# Patient Record
Sex: Female | Born: 1990 | Race: White | Hispanic: No | Marital: Single | Smoking: Current every day smoker
Health system: Southern US, Community
[De-identification: ages and names within clinical notes are randomized; demographics above are authoritative.]

## PROBLEM LIST (undated history)

## (undated) DIAGNOSIS — D689 Coagulation defect, unspecified: Secondary | ICD-10-CM

## (undated) DIAGNOSIS — F419 Anxiety disorder, unspecified: Secondary | ICD-10-CM

## (undated) DIAGNOSIS — A64 Unspecified sexually transmitted disease: Secondary | ICD-10-CM

## (undated) DIAGNOSIS — F1910 Other psychoactive substance abuse, uncomplicated: Secondary | ICD-10-CM

## (undated) DIAGNOSIS — B009 Herpesviral infection, unspecified: Secondary | ICD-10-CM

## (undated) DIAGNOSIS — B1920 Unspecified viral hepatitis C without hepatic coma: Secondary | ICD-10-CM

## (undated) HISTORY — DX: Herpesviral infection, unspecified: B00.9

## (undated) HISTORY — PX: OTHER SURGICAL HISTORY: SHX169

## (undated) HISTORY — PX: WRIST SURGERY: SHX841

## (undated) HISTORY — DX: Unspecified sexually transmitted disease: A64

## (undated) HISTORY — DX: Other psychoactive substance abuse, uncomplicated: F19.10

## (undated) HISTORY — DX: Anxiety disorder, unspecified: F41.9

## (undated) HISTORY — DX: Coagulation defect, unspecified: D68.9

## (undated) HISTORY — DX: Unspecified viral hepatitis C without hepatic coma: B19.20

---

## 2015-12-07 ENCOUNTER — Encounter: Payer: Self-pay | Admitting: Obstetrics and Gynecology

## 2015-12-07 ENCOUNTER — Ambulatory Visit (INDEPENDENT_AMBULATORY_CARE_PROVIDER_SITE_OTHER): Payer: PRIVATE HEALTH INSURANCE | Admitting: Obstetrics and Gynecology

## 2015-12-07 VITALS — BP 136/78 | HR 80 | Resp 12 | Ht 66.5 in | Wt 160.0 lb

## 2015-12-07 DIAGNOSIS — Z124 Encounter for screening for malignant neoplasm of cervix: Secondary | ICD-10-CM | POA: Diagnosis not present

## 2015-12-07 DIAGNOSIS — Z3009 Encounter for other general counseling and advice on contraception: Secondary | ICD-10-CM

## 2015-12-07 DIAGNOSIS — N946 Dysmenorrhea, unspecified: Secondary | ICD-10-CM | POA: Insufficient documentation

## 2015-12-07 DIAGNOSIS — Z87898 Personal history of other specified conditions: Secondary | ICD-10-CM | POA: Diagnosis not present

## 2015-12-07 DIAGNOSIS — Z01419 Encounter for gynecological examination (general) (routine) without abnormal findings: Secondary | ICD-10-CM | POA: Diagnosis not present

## 2015-12-07 DIAGNOSIS — Z0000 Encounter for general adult medical examination without abnormal findings: Secondary | ICD-10-CM | POA: Diagnosis not present

## 2015-12-07 DIAGNOSIS — B199 Unspecified viral hepatitis without hepatic coma: Secondary | ICD-10-CM | POA: Insufficient documentation

## 2015-12-07 DIAGNOSIS — A6000 Herpesviral infection of urogenital system, unspecified: Secondary | ICD-10-CM | POA: Insufficient documentation

## 2015-12-07 DIAGNOSIS — F1911 Other psychoactive substance abuse, in remission: Secondary | ICD-10-CM | POA: Insufficient documentation

## 2015-12-07 DIAGNOSIS — B1920 Unspecified viral hepatitis C without hepatic coma: Secondary | ICD-10-CM | POA: Diagnosis not present

## 2015-12-07 DIAGNOSIS — N898 Other specified noninflammatory disorders of vagina: Secondary | ICD-10-CM | POA: Diagnosis not present

## 2015-12-07 LAB — CBC
HCT: 39.9 % (ref 35.0–45.0)
HEMOGLOBIN: 13.1 g/dL (ref 11.7–15.5)
MCH: 28.3 pg (ref 27.0–33.0)
MCHC: 32.8 g/dL (ref 32.0–36.0)
MCV: 86.2 fL (ref 80.0–100.0)
MPV: 10.7 fL (ref 7.5–12.5)
Platelets: 262 10*3/uL (ref 140–400)
RBC: 4.63 MIL/uL (ref 3.80–5.10)
RDW: 16 % — ABNORMAL HIGH (ref 11.0–15.0)
WBC: 6.9 10*3/uL (ref 3.8–10.8)

## 2015-12-07 MED ORDER — NAPROXEN SODIUM 550 MG PO TABS: 550.0000 mg | ORAL_TABLET | Freq: Two times a day (BID) | ORAL | 2 refills | Status: DC

## 2015-12-07 MED ORDER — LEVONORGEST-ETH ESTRAD 91-DAY 0.1-0.02 & 0.01 MG PO TABS: 1.0000 | ORAL_TABLET | Freq: Every day | ORAL | 0 refills | Status: DC

## 2015-12-07 MED ORDER — VALACYCLOVIR HCL 500 MG PO TABS: ORAL_TABLET | ORAL | 1 refills | Status: DC

## 2015-12-07 NOTE — Progress Notes (Signed)
25 y.o. G0P0000 SingleCaucasianF here for annual exam.  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  She had her first outbreak of HSV 2 weeks ago. She was treated with Valtrex, she currently has a small vulvar bump.  She has hepatitis C, her rehab facility is getting her into see a specialist. She doesn't think her LFT's were elevated at last check.  She is on OCP's Period Duration (Days): 5 days  Period Pattern: Regular Menstrual Flow: Moderate Menstrual Control: Tampon Dysmenorrhea: (!) Severe Dysmenorrhea Symptoms: Cramping, Nausea  Saturates a regular tampon in 4 hours. No BTB. Cramps are severe even on OCP's, gets nausea and backaches. Able to function, Aleve helps.  Currently sexually active, new partner x 2 months. Always uses a condom. Just had full STD testing at the walk in clinic 2 weeks ago, all negative.   Patient's last menstrual period was 11/05/2015.          Sexually active: Yes.    The current method of family planning is OCP (estrogen/progesterone).    Exercising: Yes.    cardio Smoker:  no  Health Maintenance: Pap:  2015 - WNL per patient History of abnormal Pap:  no TDaP:  unsure Gardasil: completed all 3    reports that she has never smoked. She has never used smokeless tobacco. She reports that she does not drink alcohol or use drugs.She is from [place], [place]. Family is very supportive. Working at [employer] as a Production assistant, radio, needs to get GED then wants to go to college.   Past Medical History:  Diagnosis Date  . Anxiety   . Clotting disorder (HCC)   . Hepatitis C virus   . HSV-2 (herpes simplex virus 2) infection   . STD (sexually transmitted disease)    HSV II  . 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 arm surgery Right   . WRIST SURGERY Left    abcess     Current Outpatient Prescriptions  Medication Sig Dispense Refill  . citalopram (CELEXA) 40 MG tablet  Take 40 mg by mouth every morning.  0  . gabapentin (NEURONTIN) 300 MG capsule Take 300 mg by mouth 2 (two) times daily.  0  . naltrexone (DEPADE) 50 MG tablet Take 50 mg by mouth every morning.  0  . traZODone (DESYREL) 100 MG tablet Take 100 mg by mouth at bedtime.  0  . TRI-LINYAH 0.18/0.215/0.25 MG-35 MCG tablet Take 1 tablet by mouth daily.  0   No current facility-administered medications for this visit.     Family History  Problem Relation Age of Onset  . Melanoma Mother   . Hypertension Father   . Hyperlipidemia Father   . Diabetes Maternal Grandmother   . Diabetes Maternal Grandfather     Review of Systems  Constitutional: Negative.   HENT: Negative.   Eyes: Negative.   Respiratory: Negative.   Cardiovascular: Negative.   Gastrointestinal: Negative.   Endocrine: Negative.   Genitourinary: Negative.   Musculoskeletal: Negative.   Skin: Negative.   Allergic/Immunologic: Negative.   Neurological: Negative.   Psychiatric/Behavioral: Negative.     Exam:   BP 136/78 (BP Location: Right Arm, Patient Position: Sitting, Cuff Size: Normal)   Pulse 80   Resp 12   Ht 5' 6.5" (1.689 m)   Wt 160 lb (72.6 kg)   LMP 11/05/2015   BMI 25.44 kg/m  Weight change: @WEIGHTCHANGE @ Height:   Height: 5' 6.5" (168.9 cm)  Ht Readings from Last 3 Encounters:  12/07/15 5' 6.5" (1.689 m)    General appearance: alert, cooperative and appears stated age Head: Normocephalic, without obvious abnormality, atraumatic Neck: no adenopathy, supple, symmetrical, trachea midline and thyroid normal to inspection and palpation Lungs: clear to auscultation bilaterally Breasts: normal appearance, no masses or tenderness Heart: regular rate and rhythm Abdomen: soft, non-tender; bowel sounds normal; no masses,  no organomegaly Extremities: extremities normal, atraumatic, no cyanosis or edema Skin: Skin color, texture, turgor normal. No rashes or lesions Lymph nodes: Cervical, supraclavicular, and  axillary nodes normal. No abnormal inguinal nodes palpated Neurologic: Grossly normal   Pelvic: External genitalia:  no lesions, bump in question is a pimple on her left upper thigh              Urethra:  normal appearing urethra with no masses, tenderness or lesions              Bartholins and Skenes: normal                 Vagina: normal appearing vagina with an increase in watery vaginal discharge, slight odor (on questioning, the patient has noticed an increased d/c)              Cervix: no lesions               Bimanual Exam:  Uterus:  normal size, contour, position, consistency, mobility, non-tender              Adnexa: no mass, fullness, tenderness               Rectovaginal: Confirms               Anus:  normal sphincter tone, no lesions  Chaperone was present for exam.  A:  Well Woman with normal exam  STD testing just done  Contraception  H/O Hepatitis C, need to check LFT's, she is on OCP's  Dysmenorrhea  HSV   Vaginal discharge  P:   Will change to loseasonique, will call in one pack, as long as LFT's are normal will call in for the rest of the year  Continue to use condoms  Screening labs  Script for valtrex for prn use  Wet prep probe  Anaprox for cramps  Discussed breast self exam  Discussed calcium and vit D intake  Information on mirena IUD given

## 2015-12-07 NOTE — Patient Instructions (Signed)
EXERCISE AND DIET:  We recommended that you start or continue a regular exercise program for good health. Regular exercise means any activity that makes your heart beat faster and makes you sweat.  We recommend exercising at least 30 minutes per day at least 3 days a week, preferably 4 or 5.  We also recommend a diet low in fat and sugar.  Inactivity, poor dietary choices and obesity can cause diabetes, heart attack, stroke, and kidney damage, among others.    ALCOHOL AND SMOKING:  Women should limit their alcohol intake to no more than 7 drinks/beers/glasses of wine (combined, not each!) per week. Moderation of alcohol intake to this level decreases your risk of breast cancer and liver damage. And of course, no recreational drugs are part of a healthy lifestyle.  And absolutely no smoking or even second hand smoke. Most people know smoking can cause heart and lung diseases, but did you know it also contributes to weakening of your bones? Aging of your skin?  Yellowing of your teeth and nails?  CALCIUM AND VITAMIN D:  Adequate intake of calcium and Vitamin D are recommended.  The recommendations for exact amounts of these supplements seem to change often, but generally speaking 600 mg of calcium (either carbonate or citrate) and 800 units of Vitamin D per day seems prudent. Certain women may benefit from higher intake of Vitamin D.  If you are among these women, your doctor will have told you during your visit.    PAP SMEARS:  Pap smears, to check for cervical cancer or precancers,  have traditionally been done yearly, although recent scientific advances have shown that most women can have pap smears less often.  However, every woman still should have a physical exam from her gynecologist every year. It will include a breast check, inspection of the vulva and vagina to check for abnormal growths or skin changes, a visual exam of the cervix, and then an exam to evaluate the size and shape of the uterus and  ovaries.  And after 23 years of age, a rectal exam is indicated to check for rectal cancers. We will also provide age appropriate advice regarding health maintenance, like when you should have certain vaccines, screening for sexually transmitted diseases, bone density testing, colonoscopy, mammograms, etc.   MAMMOGRAMS:  All women over 40 years old should have a yearly mammogram. Many facilities now offer a "3D" mammogram, which may cost around $50 extra out of pocket. If possible,  we recommend you accept the option to have the 3D mammogram performed.  It both reduces the number of women who will be called back for extra views which then turn out to be normal, and it is better than the routine mammogram at detecting truly abnormal areas.    COLONOSCOPY:  Colonoscopy to screen for colon cancer is recommended for all women at age 50.  We know, you hate the idea of the prep.  We agree, BUT, having colon cancer and not knowing it is worse!!  Colon cancer so often starts as a polyp that can be seen and removed at colonscopy, which can quite literally save your life!  And if your first colonoscopy is normal and you have no family history of colon cancer, most women don't have to have it again for 10 years.  Once every ten years, you can do something that may end up saving your life, right?  We will be happy to help you get it scheduled when you are ready.    Be sure to check your insurance coverage so you understand how much it will cost.  It may be covered as a preventative service at no cost, but you should check your particular policy.     Oral Contraception Information Oral contraceptive pills (OCPs) are medicines taken to prevent pregnancy. OCPs work by preventing the ovaries from releasing eggs. The hormones in OCPs also cause the cervical mucus to thicken, preventing the sperm from entering the uterus. The hormones also cause the uterine lining to become thin, not allowing a fertilized egg to attach to the  inside of the uterus. OCPs are highly effective when taken exactly as prescribed. However, OCPs do not prevent sexually transmitted diseases (STDs). Safe sex practices, such as using condoms along with the pill, can help prevent STDs.  Before taking the pill, you may have a physical exam and Pap test. Your health care provider may order blood tests. The health care provider will make sure you are a good candidate for oral contraception. Discuss with your health care provider the possible side effects of the OCP you may be prescribed. When starting an OCP, it can take 2 to 3 months for the body to adjust to the changes in hormone levels in your body.  TYPES OF ORAL CONTRACEPTION  The combination pill--This pill contains estrogen and progestin (synthetic progesterone) hormones. The combination pill comes in 21-day, 28-day, or 91-day packs. Some types of combination pills are meant to be taken continuously (365-day pills). With 21-day packs, you do not take pills for 7 days after the last pill. With 28-day packs, the pill is taken every day. The last 7 pills are without hormones. Certain types of pills have more than 21 hormone-containing pills. With 91-day packs, the first 84 pills contain both hormones, and the last 7 pills contain no hormones or contain estrogen only.  The minipill--This pill contains the progesterone hormone only. The pill is taken every day continuously. It is very important to take the pill at the same time each day. The minipill comes in packs of 28 pills. All 28 pills contain the hormone.  ADVANTAGES OF ORAL CONTRACEPTIVE PILLS  Decreases premenstrual symptoms.   Treats menstrual period cramps.   Regulates the menstrual cycle.   Decreases a heavy menstrual flow.   May treatacne, depending on the type of pill.   Treats abnormal uterine bleeding.   Treats polycystic ovarian syndrome.   Treats endometriosis.   Can be used as emergency contraception.  THINGS  THAT CAN MAKE ORAL CONTRACEPTIVE PILLS LESS EFFECTIVE OCPs can be less effective if:   You forget to take the pill at the same time every day.   You have a stomach or intestinal disease that lessens the absorption of the pill.   You take OCPs with other medicines that make OCPs less effective, such as antibiotics, certain HIV medicines, and some seizure medicines.   You take expired OCPs.   You forget to restart the pill on day 7, when using the packs of 21 pills.  RISKS ASSOCIATED WITH ORAL CONTRACEPTIVE PILLS  Oral contraceptive pills can sometimes cause side effects, such as:  Headache.  Nausea.  Breast tenderness.  Irregular bleeding or spotting. Combination pills are also associated with a small increased risk of:  Blood clots.  Heart attack.  Stroke.   This information is not intended to replace advice given to you by your health care provider. Make sure you discuss any questions you have with your health care provider.     Document Released: 06/16/2002 Document Revised: 01/14/2013 Document Reviewed: 09/14/2012 Elsevier Interactive Patient Education 2016 Elsevier Inc.  Breast Self-Awareness Practicing breast self-awareness may pick up problems early, prevent significant medical complications, and possibly save your life. By practicing breast self-awareness, you can become familiar with how your breasts look and feel and if your breasts are changing. This allows you to notice changes early. It can also offer you some reassurance that your breast health is good. One way to learn what is normal for your breasts and whether your breasts are changing is to do a breast self-exam. If you find a lump or something that was not present in the past, it is best to contact your caregiver right away. Other findings that should be evaluated by your caregiver include nipple discharge, especially if it is bloody; skin changes or reddening; areas where the skin seems to be pulled in  (retracted); or new lumps and bumps. Breast pain is seldom associated with cancer (malignancy), but should also be evaluated by a caregiver. HOW TO PERFORM A BREAST SELF-EXAM The best time to examine your breasts is 5-7 days after your menstrual period is over. During menstruation, the breasts are lumpier, and it may be more difficult to pick up changes. If you do not menstruate, have reached menopause, or had your uterus removed (hysterectomy), you should examine your breasts at regular intervals, such as monthly. If you are breastfeeding, examine your breasts after a feeding or after using a breast pump. Breast implants do not decrease the risk for lumps or tumors, so continue to perform breast self-exams as recommended. Talk to your caregiver about how to determine the difference between the implant and breast tissue. Also, talk about the amount of pressure you should use during the exam. Over time, you will become more familiar with the variations of your breasts and more comfortable with the exam. A breast self-exam requires you to remove all your clothes above the waist. 1. Look at your breasts and nipples. Stand in front of a mirror in a room with good lighting. With your hands on your hips, push your hands firmly downward. Look for a difference in shape, contour, and size from one breast to the other (asymmetry). Asymmetry includes puckers, dips, or bumps. Also, look for skin changes, such as reddened or scaly areas on the breasts. Look for nipple changes, such as discharge, dimpling, repositioning, or redness. 2. Carefully feel your breasts. This is best done either in the shower or tub while using soapy water or when flat on your back. Place the arm (on the side of the breast you are examining) above your head. Use the pads (not the fingertips) of your three middle fingers on your opposite hand to feel your breasts. Start in the underarm area and use  inch (2 cm) overlapping circles to feel your  breast. Use 3 different levels of pressure (light, medium, and firm pressure) at each circle before moving to the next circle. The light pressure is needed to feel the tissue closest to the skin. The medium pressure will help to feel breast tissue a little deeper, while the firm pressure is needed to feel the tissue close to the ribs. Continue the overlapping circles, moving downward over the breast until you feel your ribs below your breast. Then, move one finger-width towards the center of the body. Continue to use the  inch (2 cm) overlapping circles to feel your breast as you move slowly up toward the collar bone (clavicle) near the  base of the neck. Continue the up and down exam using all 3 pressures until you reach the middle of the chest. Do this with each breast, carefully feeling for lumps or changes. 3.  Keep a written record with breast changes or normal findings for each breast. By writing this information down, you do not need to depend only on memory for size, tenderness, or location. Write down where you are in your menstrual cycle, if you are still menstruating. Breast tissue can have some lumps or thick tissue. However, see your caregiver if you find anything that concerns you.  SEEK MEDICAL CARE IF:  You see a change in shape, contour, or size of your breasts or nipples.   You see skin changes, such as reddened or scaly areas on the breasts or nipples.   You have an unusual discharge from your nipples.   You feel a new lump or unusually thick areas.    This information is not intended to replace advice given to you by your health care provider. Make sure you discuss any questions you have with your health care provider.   Document Released: 03/26/2005 Document Revised: 03/12/2012 Document Reviewed: 07/11/2011 Elsevier Interactive Patient Education 2016 Elsevier Inc.  

## 2015-12-08 ENCOUNTER — Telehealth: Payer: Self-pay | Admitting: *Deleted

## 2015-12-08 LAB — COMPREHENSIVE METABOLIC PANEL
ALBUMIN: 3.8 g/dL (ref 3.6–5.1)
ALK PHOS: 37 U/L (ref 33–115)
ALT: 145 U/L — AB (ref 6–29)
AST: 87 U/L — AB (ref 10–30)
BILIRUBIN TOTAL: 0.4 mg/dL (ref 0.2–1.2)
BUN: 12 mg/dL (ref 7–25)
CHLORIDE: 104 mmol/L (ref 98–110)
CO2: 28 mmol/L (ref 20–31)
CREATININE: 0.88 mg/dL (ref 0.50–1.10)
Calcium: 9.1 mg/dL (ref 8.6–10.2)
Glucose, Bld: 66 mg/dL (ref 65–99)
Potassium: 4.6 mmol/L (ref 3.5–5.3)
SODIUM: 139 mmol/L (ref 135–146)
TOTAL PROTEIN: 7.4 g/dL (ref 6.1–8.1)

## 2015-12-08 LAB — WET PREP BY MOLECULAR PROBE
Candida species: NEGATIVE
Gardnerella vaginalis: POSITIVE — AB
Trichomonas vaginosis: NEGATIVE

## 2015-12-08 LAB — LIPID PANEL
CHOLESTEROL: 170 mg/dL (ref 125–200)
HDL: 66 mg/dL (ref >=46)
LDL Cholesterol: 84 mg/dL (ref <130)
TRIGLYCERIDES: 100 mg/dL (ref <150)
Total CHOL/HDL Ratio: 2.6 Ratio (ref <=5.0)
VLDL: 20 mg/dL (ref <30)

## 2015-12-08 LAB — VITAMIN D 25 HYDROXY (VIT D DEFICIENCY, FRACTURES): Vit D, 25-Hydroxy: 35 ng/mL (ref 30–100)

## 2015-12-08 NOTE — Telephone Encounter (Signed)
Left message to call regarding results -eh 

## 2015-12-08 NOTE — Telephone Encounter (Signed)
-----   Message from Romualdo BolkJill Evelyn Jertson, MD sent at 12/08/2015 10:03 AM EDT ----- Please inform the patient that her LFT's are mild to moderately elevated (she has hepatitis C and is getting in to see a specialist). Oral contraception is not the best method of contraception for her. The mirena IUD would be a better choice for her (heavy/crampy cycles), we briefly discussed the IUD yesterday, information was given. See if she wants to come in to further discuss, or if she wants to make an appointment for IUD insertion. If so would pre-treat with cytotec.  She is also + for BV, please treat with metrogel, 1 application intravaginally qhs x 5 days.  The rest of her blood work was normal.  Pap is pending.

## 2015-12-08 NOTE — Telephone Encounter (Signed)
Patient is returning a call to Elaine. °

## 2015-12-09 NOTE — Telephone Encounter (Signed)
Called patient back- left another message to call back for lab results-eh

## 2015-12-13 ENCOUNTER — Other Ambulatory Visit: Payer: Self-pay | Admitting: Obstetrics and Gynecology

## 2015-12-13 LAB — IPS PAP TEST WITH HPV

## 2015-12-13 NOTE — Telephone Encounter (Signed)
Left patient another message returning -eh

## 2015-12-13 NOTE — Telephone Encounter (Signed)
Patient returning call.

## 2015-12-20 ENCOUNTER — Other Ambulatory Visit: Payer: Self-pay

## 2015-12-20 ENCOUNTER — Encounter: Payer: Self-pay | Admitting: Obstetrics and Gynecology

## 2015-12-20 ENCOUNTER — Ambulatory Visit (INDEPENDENT_AMBULATORY_CARE_PROVIDER_SITE_OTHER): Payer: PRIVATE HEALTH INSURANCE | Admitting: Obstetrics and Gynecology

## 2015-12-20 VITALS — BP 120/80 | HR 82 | Resp 16 | Ht 65.5 in | Wt 161.4 lb

## 2015-12-20 DIAGNOSIS — Z3009 Encounter for other general counseling and advice on contraception: Secondary | ICD-10-CM | POA: Diagnosis not present

## 2015-12-20 DIAGNOSIS — B1920 Unspecified viral hepatitis C without hepatic coma: Secondary | ICD-10-CM | POA: Diagnosis not present

## 2015-12-20 DIAGNOSIS — R7989 Other specified abnormal findings of blood chemistry: Secondary | ICD-10-CM

## 2015-12-20 DIAGNOSIS — R945 Abnormal results of liver function studies: Secondary | ICD-10-CM

## 2015-12-20 MED ORDER — METRONIDAZOLE 0.75 % VA GEL: 1.0000 | Freq: Every day | VAGINAL | 0 refills | Status: AC

## 2015-12-20 MED ORDER — MISOPROSTOL 200 MCG PO TABS: ORAL_TABLET | ORAL | 0 refills | Status: DC

## 2015-12-20 NOTE — Progress Notes (Signed)
GYNECOLOGY  VISIT   HPI: 25 y.o.   Single  Caucasian  female   G0P0000 with Patient's last menstrual period was 12/14/2015.   here to discuss Mirena IUD.    The patient has a h/o hepatitis C. At her recent annual exam, LFT's were drawn and were slightly elevated. She is currently on OCP's and is here to discuss changing to an IUD secondary to her liver disease.  She is 120 days clean.  Prior to OCP's cycles were monthly, heavy and crampy  GYNECOLOGIC HISTORY: Patient's last menstrual period was 12/14/2015. Contraception:OCP's  Menopausal hormone therapy: none        OB History    Gravida Para Term Preterm AB Living   0 0 0 0 0 0   SAB TAB Ectopic Multiple Live Births   0 0 0 0 0         Patient Active Problem List   Diagnosis Date Noted  . Dysmenorrhea 12/07/2015  . 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 Hepatitis, viral 12/07/2015  . History of substance abuse 12/07/2015    Past Medical History:  Diagnosis Date  . Anxiety   . Clotting disorder (HCC)   . Hepatitis C virus   . HSV-2 (herpes simplex virus 2) infection   . STD (sexually transmitted disease)    HSV II  . 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 arm surgery Right   . WRIST SURGERY Left    abcess     Current Outpatient Prescriptions  Medication Sig Dispense Refill  . citalopram (CELEXA) 40 MG tablet Take 40 mg by mouth every morning.  0  . gabapentin (NEURONTIN) 300 MG capsule Take 300 mg by mouth 2 (two) times daily.  0  . Levonorgestrel-Ethinyl Estradiol (LOSEASONIQUE) 0.1-0.02 & 0.01 MG tablet Take 1 tablet by mouth daily. 1 Package 0  . naltrexone (DEPADE) 50 MG tablet Take 50 mg by mouth every morning.  0  . naproxen sodium (ANAPROX DS) 550 MG tablet Take 1 tablet (550 mg total) by mouth 2 (two) times daily with a meal. 30 tablet 2  . traZODone (DESYREL) 100 MG tablet Take 100 mg by mouth at bedtime.  0  .  valACYclovir (VALTREX) 500 MG tablet Take 1 tablet po bid x 3 days prn 30 tablet 1   No current facility-administered medications for this visit.      ALLERGIES: Review of patient's allergies indicates no known allergies.  Family History  Problem Relation Age of Onset  . Melanoma Mother   . Hypertension Father   . Hyperlipidemia Father   . Diabetes Maternal Grandmother   . Diabetes Maternal Grandfather     Social History   Social History  . Marital status: Single    Spouse name: N/A  . Number of children: N/A  . Years of education: N/A   Occupational History  . Not on file.   Social History Main Topics  . Smoking status: Never Smoker  . Smokeless tobacco: Never Used  . Alcohol use No  . Drug use: No     Comment: clean for 100 days from opioids and herion 12-07-15   . Sexual activity: Yes    Partners: Male    Birth control/ protection: Pill   Other Topics Concern  . Not on file   Social History Narrative  . No narrative on file    Review of  Systems  Constitutional: Negative.   HENT: Negative.   Eyes: Negative.   Respiratory: Negative.   Cardiovascular: Negative.   Gastrointestinal: Negative.   Genitourinary: Negative.   Musculoskeletal: Negative.   Skin: Negative.   Neurological: Negative.   Endo/Heme/Allergies: Negative.   Psychiatric/Behavioral: Negative.     PHYSICAL EXAMINATION:    BP 120/80 (BP Location: Right Arm, Patient Position: Sitting, Cuff Size: Normal)   Pulse 82   Resp 16   Ht 5' 5.5" (1.664 m)   Wt 161 lb 6.4 oz (73.2 kg)   LMP 12/14/2015   BMI 26.45 kg/m     General appearance: alert, cooperative and appears stated age  ASSESSMENT H/O hepatitis C, slightly elevated LFT's STD testing was done, need a copy Contraception, on OCP's, have recommended she switch to an IUD to prevent effects on her liver BV on recent testing H/O heroin abuse, sober for 120 days    PLAN Referral to Hepatologist (she was supposed to be referred by  her drug rehab center, hasn't happened yet, she requests we set her up) Metrogel called in today F/U in 1 week for [facility] IUD insertion, will pre-treat with cytotec She is currently on her cycle. She will stay on OCP's until after IUD insertion We discussed importance of long term condom usage     An After Visit Summary was printed and given to the patient.  Over 20 minutes face to face time of which over 50% was spent in counseling.

## 2015-12-20 NOTE — Telephone Encounter (Signed)
Summer, CMA spoke with patient and informed of results -see result note -eh

## 2015-12-20 NOTE — Telephone Encounter (Signed)
Per Dr. Salli QuarryJertson's note, Metrogel prescription was sent into pharmacy on file. -sco

## 2015-12-20 NOTE — Patient Instructions (Signed)
Intrauterine Device Insertion Most often, an intrauterine device (IUD) is inserted into the uterus to prevent pregnancy. There are 2 types of IUDs available:  Copper IUD--This type of IUD creates an environment that is not favorable to sperm survival. The mechanism of action of the copper IUD is not known for certain. It can stay in place for 10 years.  Hormone IUD--This type of IUD contains the hormone progestin (synthetic progesterone). The progestin thickens the cervical mucus and prevents sperm from entering the uterus, and it also thins the uterine lining. There is no evidence that the hormone IUD prevents implantation. One hormone IUD can stay in place for up to 5 years, and a different hormone IUD can stay in place for up to 3 years. An IUD is the most cost-effective birth control if left in place for the full duration. It may be removed at any time. LET YOUR HEALTH CARE PROVIDER KNOW ABOUT:  Any allergies you have.  All medicines you are taking, including vitamins, herbs, eye drops, creams, and over-the-counter medicines.  Previous problems you or members of your family have had with the use of anesthetics.  Any blood disorders you have.  Previous surgeries you have had.  Possibility of pregnancy.  Medical conditions you have. RISKS AND COMPLICATIONS  Generally, intrauterine device insertion is a safe procedure. However, as with any procedure, complications can occur. Possible complications include:  Accidental puncture (perforation) of the uterus.  Accidental placement of the IUD either in the muscle layer of the uterus (myometrium) or outside the uterus. If this happens, the IUD can be found essentially floating around the bowels and must be taken out surgically.  The IUD may fall out of the uterus (expulsion). This is more common in women who have recently had a child.   Pregnancy in the fallopian tube (ectopic).  Pelvic inflammatory disease (PID), which is infection of  the uterus and fallopian tubes. The risk of PID is slightly increased in the first 20 days after the IUD is placed, but the overall risk is still very low. BEFORE THE PROCEDURE  Schedule the IUD insertion for when you will have your menstrual period or right after, to make sure you are not pregnant. Placement of the IUD is better tolerated shortly after a menstrual cycle.  You may need to take tests or be examined to make sure you are not pregnant.  You may be required to take a pregnancy test.  You may be required to get checked for sexually transmitted infections (STIs) prior to placement. Placing an IUD in someone who has an infection can make the infection worse.  You may be given a pain reliever to take 1 or 2 hours before the procedure.  An exam will be performed to determine the size and position of your uterus.  Ask your health care provider about changing or stopping your regular medicines. PROCEDURE   A tool (speculum) is placed in the vagina. This allows your health care provider to see the lower part of the uterus (cervix).  The cervix is prepped with a medicine that lowers the risk of infection.  You may be given a medicine to numb each side of the cervix (intracervical or paracervical block). This is used to block and control any discomfort with insertion.  A tool (uterine sound) is inserted into the uterus to determine the length of the uterine cavity and the direction the uterus may be tilted.  A slim instrument (IUD inserter) is inserted through the cervical   canal and into your uterus.  The IUD is placed in the uterine cavity and the insertion device is removed.  The nylon string that is attached to the IUD and used for eventual IUD removal is trimmed. It is trimmed so that it lays high in the vagina, just outside the cervix. AFTER THE PROCEDURE  You may have bleeding after the procedure. This is normal. It varies from light spotting for a few days to menstrual-like  bleeding.  You may have mild cramping.   This information is not intended to replace advice given to you by your health care provider. Make sure you discuss any questions you have with your health care provider.   Document Released: 11/22/2010 Document Revised: 01/14/2013 Document Reviewed: 09/14/2012 Elsevier Interactive Patient Education 2016 [employer]  

## 2015-12-27 ENCOUNTER — Telehealth: Payer: Self-pay | Admitting: Obstetrics and Gynecology

## 2015-12-27 NOTE — Telephone Encounter (Signed)
Spoke with patient. Patient states she has been on her  cycle since 12/08/15; reporting light bleeding currently. Patient would like to know if still ok to go ahead with IUD placement? Advised patient if she has been taking OCP daily and has not missed any doses, ok to go ahead with placement. Patient states she has taken OCP daily, no missed doses. Patient also ask if ok to use metrogel at night and place the cytotec? Advised patient to place metrogel tonight and place cytotec early in the morning, 5-6am,  in order to be effective. IUD placement scheduled 12/28/15 at 1:00pm with Dr. Oscar LaJertson. Patient is agreeable.  Dr. Reyne DumasJerston, do you agree with recommendations?

## 2015-12-27 NOTE — Telephone Encounter (Signed)
Patient has a few questions before having her IUD inserted tomorrow.

## 2015-12-28 ENCOUNTER — Ambulatory Visit (INDEPENDENT_AMBULATORY_CARE_PROVIDER_SITE_OTHER): Payer: PRIVATE HEALTH INSURANCE | Admitting: Obstetrics and Gynecology

## 2015-12-28 ENCOUNTER — Encounter: Payer: Self-pay | Admitting: Obstetrics and Gynecology

## 2015-12-28 VITALS — BP 138/60 | HR 84 | Resp 12 | Wt 165.0 lb

## 2015-12-28 DIAGNOSIS — Z113 Encounter for screening for infections with a predominantly sexual mode of transmission: Secondary | ICD-10-CM

## 2015-12-28 DIAGNOSIS — B1920 Unspecified viral hepatitis C without hepatic coma: Secondary | ICD-10-CM

## 2015-12-28 DIAGNOSIS — Z30430 Encounter for insertion of intrauterine contraceptive device: Secondary | ICD-10-CM | POA: Diagnosis not present

## 2015-12-28 DIAGNOSIS — Z01812 Encounter for preprocedural laboratory examination: Secondary | ICD-10-CM

## 2015-12-28 DIAGNOSIS — Z3009 Encounter for other general counseling and advice on contraception: Secondary | ICD-10-CM

## 2015-12-28 LAB — POCT URINE PREGNANCY: PREG TEST UR: NEGATIVE

## 2015-12-28 NOTE — Patient Instructions (Signed)

## 2015-12-28 NOTE — Progress Notes (Addendum)
GYNECOLOGY  VISIT   HPI: 25 y.o.   Single  Caucasian  female   [id number] with Patient's last menstrual period was 12/08/2015.   here for IUD insertion. She has hepatitis C and has mildly elevated LFT's. Awaiting appointment at the hepatitis C clinic.   GYNECOLOGIC HISTORY: Patient's last menstrual period was 12/08/2015. Contraception:OCP Menopausal hormone therapy: None         OB History    Gravida Para Term Preterm AB Living   0 0 0 0 0 0   SAB TAB Ectopic Multiple Live Births   0 0 0 0 0         Patient Active Problem List   Diagnosis Date Noted  . Dysmenorrhea 12/07/2015  . 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 Hepatitis, viral 12/07/2015  . History of substance abuse 12/07/2015    Past Medical History:  Diagnosis Date  . Anxiety   . Clotting disorder (HCC)   . Hepatitis C virus   . HSV-2 (herpes simplex virus 2) infection   . STD (sexually transmitted disease)    HSV II  . 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 arm surgery Right   . WRIST SURGERY Left    abcess     Current Outpatient Prescriptions  Medication Sig Dispense Refill  . citalopram (CELEXA) 40 MG tablet Take 40 mg by mouth every morning.  0  . gabapentin (NEURONTIN) 300 MG capsule Take 300 mg by mouth 2 (two) times daily.  0  . Levonorgestrel-Ethinyl Estradiol (LOSEASONIQUE) 0.1-0.02 & 0.01 MG tablet Take 1 tablet by mouth daily. 1 Package 0  . misoprostol (CYTOTEC) 200 MCG tablet Place 2 tablets intravaginally 6-12 hours prior to IUD insertion 2 tablet 0  . naltrexone (DEPADE) 50 MG tablet Take 50 mg by mouth every morning.  0  . naproxen sodium (ANAPROX DS) 550 MG tablet Take 1 tablet (550 mg total) by mouth 2 (two) times daily with a meal. 30 tablet 2  . traZODone (DESYREL) 100 MG tablet Take 100 mg by mouth at bedtime.  0  . valACYclovir (VALTREX) 500 MG tablet Take 1 tablet po bid x 3 days prn 30 tablet 1   No  current facility-administered medications for this visit.      ALLERGIES: Review of patient's allergies indicates no known allergies.  Family History  Problem Relation Age of Onset  . Melanoma Mother   . Hypertension Father   . Hyperlipidemia Father   . Diabetes Maternal Grandmother   . Diabetes Maternal Grandfather     Social History   Social History  . Marital status: Single    Spouse name: N/A  . Number of children: N/A  . Years of education: N/A   Occupational History  . Not on file.   Social History Main Topics  . Smoking status: Never Smoker  . Smokeless tobacco: Never Used  . Alcohol use No  . Drug use: No     Comment: clean for 100 days from opioids and herion 12-07-15   . Sexual activity: Yes    Partners: Male    Birth control/ protection: Pill   Other Topics Concern  . Not on file   Social History Narrative  . No narrative on file    Review of Systems  Constitutional: Negative.   HENT: Negative.   Eyes: Negative.   Respiratory: Negative.   Cardiovascular: Negative.  Gastrointestinal: Negative.   Genitourinary: Negative.   Musculoskeletal: Negative.   Skin: Negative.   Neurological: Negative.   Endo/Heme/Allergies: Negative.   Psychiatric/Behavioral: Negative.     PHYSICAL EXAMINATION:    BP 138/60 (BP Location: Right Arm, Patient Position: Sitting, Cuff Size: Normal)   Pulse 84   Resp 12   Wt 165 lb (74.8 kg)   LMP 12/08/2015   BMI 27.04 kg/m     General appearance: alert, cooperative and appears stated age  Pelvic: External genitalia:  no lesions              Urethra:  normal appearing urethra with no masses, tenderness or lesions              Bartholins and Skenes: normal                 Vagina: normal appearing vagina with normal color and discharge, no lesions              Cervix: no lesions  The risks of the kyleena IUD were reviewed with the patient, including infection, abnormal bleeding and uterine perfortion. Consent was  signed.  A speculum was placed in the vagina, the cervix was cleansed with betadine. A tenaculum was placed on the cervix, the uterus sounded to 6-7 cm.  The kyleena IUD was inserted without difficulty. The string were cut to 3-4 cm. The tenaculum was removed. Slight oozing from the tenaculum site was stopped with pressure.   The patient tolerated the procedure well.    Chaperone was present for exam.  ASSESSMENT Kyleena IUD insertion STD screening, records haven't been received yet Hepatitis C    PLAN F/U in 1 month Genprobe Check on referral to hepatitis C clinic   An After Visit Summary was printed and given to the patient.  Addendum:  Labs from 11/22/15 Negative GC Negative Chlamydia RPR Non reactive HIV Non Reactive

## 2015-12-29 ENCOUNTER — Telehealth: Payer: Self-pay | Admitting: Obstetrics and Gynecology

## 2015-12-29 LAB — GC/CHLAMYDIA PROBE AMP
CT PROBE, AMP APTIMA: NOT DETECTED
GC PROBE AMP APTIMA: NOT DETECTED

## 2015-12-29 NOTE — Telephone Encounter (Signed)
Left message on voicemail to call with information about where she had labs done. Per [facility] they do not have any labs on her. Release is in records folder.

## 2016-01-09 ENCOUNTER — Telehealth: Payer: Self-pay | Admitting: Obstetrics and Gynecology

## 2016-01-09 NOTE — Telephone Encounter (Signed)
Left message regarding upcoming appointment has been canceled and needs to be rescheduled. °

## 2016-01-17 ENCOUNTER — Encounter (HOSPITAL_COMMUNITY): Payer: Self-pay | Admitting: Emergency Medicine

## 2016-01-17 ENCOUNTER — Emergency Department (HOSPITAL_COMMUNITY)
Admission: EM | Admit: 2016-01-17 | Discharge: 2016-01-17 | Disposition: A | Discharge status: Home / Self Care | Payer: PRIVATE HEALTH INSURANCE | Source: Home / Self Care | Attending: Emergency Medicine | Admitting: Emergency Medicine

## 2016-01-17 DIAGNOSIS — R569 Unspecified convulsions: Secondary | ICD-10-CM | POA: Diagnosis not present

## 2016-01-17 DIAGNOSIS — Z79899 Other long term (current) drug therapy: Secondary | ICD-10-CM

## 2016-01-17 DIAGNOSIS — T401X1A Poisoning by heroin, accidental (unintentional), initial encounter: Secondary | ICD-10-CM

## 2016-01-17 LAB — RAPID URINE DRUG SCREEN, HOSP PERFORMED
AMPHETAMINES: NOT DETECTED
BARBITURATES: NOT DETECTED
BENZODIAZEPINES: NOT DETECTED
Cocaine: POSITIVE — AB
Opiates: POSITIVE — AB
Tetrahydrocannabinol: NOT DETECTED

## 2016-01-17 LAB — COMPREHENSIVE METABOLIC PANEL
ALBUMIN: 4 g/dL (ref 3.5–5.0)
ALK PHOS: 37 U/L — AB (ref 38–126)
ALT: 72 U/L — AB (ref 14–54)
AST: 52 U/L — AB (ref 15–41)
Anion gap: 12 (ref 5–15)
BILIRUBIN TOTAL: 0.5 mg/dL (ref 0.3–1.2)
BUN: 13 mg/dL (ref 6–20)
CO2: 23 mmol/L (ref 22–32)
CREATININE: 1.07 mg/dL — AB (ref 0.44–1.00)
Calcium: 8.6 mg/dL — ABNORMAL LOW (ref 8.9–10.3)
Chloride: 102 mmol/L (ref 101–111)
GFR calc Af Amer: >60 mL/min (ref >60)
GLUCOSE: 241 mg/dL — AB (ref 65–99)
Potassium: 3.5 mmol/L (ref 3.5–5.1)
Sodium: 137 mmol/L (ref 135–145)
TOTAL PROTEIN: 8 g/dL (ref 6.5–8.1)

## 2016-01-17 LAB — CBC
HEMATOCRIT: 36.1 % (ref 36.0–46.0)
Hemoglobin: 11.9 g/dL — ABNORMAL LOW (ref 12.0–15.0)
MCH: 28.7 pg (ref 26.0–34.0)
MCHC: 33 g/dL (ref 30.0–36.0)
MCV: 87.2 fL (ref 78.0–100.0)
PLATELETS: 240 10*3/uL (ref 150–400)
RBC: 4.14 MIL/uL (ref 3.87–5.11)
RDW: 13.9 % (ref 11.5–15.5)
WBC: 6.9 10*3/uL (ref 4.0–10.5)

## 2016-01-17 LAB — SALICYLATE LEVEL: Salicylate Lvl: <7 mg/dL (ref 2.8–30.0)

## 2016-01-17 LAB — ETHANOL

## 2016-01-17 LAB — CBG MONITORING, ED: GLUCOSE-CAPILLARY: 237 mg/dL — AB (ref 65–99)

## 2016-01-17 LAB — ACETAMINOPHEN LEVEL: Acetaminophen (Tylenol), Serum: <10 ug/mL — ABNORMAL LOW (ref 10–30)

## 2016-01-17 NOTE — ED Notes (Signed)
Pt looking for wallet. Called EMS dispatch who then talked to the EMS who picked pt up on scene. The EMT stated that she never saw a wallet and was handed just an ID card by police.

## 2016-01-17 NOTE — ED Triage Notes (Signed)
Pt found unresponsive outside of home that she was kicked out of 2 days ago; respiratory arrest per EMS, pinpoint pupils; pt received 2mg  of Narcan and became responsive several minutes later; pt reports doing heroin, cocaine, and crack tonight; denies alcohol; A&Ox4

## 2016-01-17 NOTE — ED Notes (Signed)
Bed: RESB Expected date:  Expected time:  Means of arrival:  Comments: 25 yo F OD

## 2016-01-17 NOTE — Telephone Encounter (Signed)
Left message to call regarding release.

## 2016-01-17 NOTE — ED Provider Notes (Addendum)
[facility]-EMERGENCY DEPT Provider Note   CSN: [id number] Arrival date & time: 01/17/16  [id number]  By signing my name below, I, Brandy Sherman, attest that this documentation has been prepared under the direction and in the presence of physician practitioner, Dione Boozeavid Antone Summons, MD. Electronically Signed: Linna Darnerussell Sherman, Scribe. 01/17/2016. 1:21 AM.  History   Chief Complaint Chief Complaint  Patient presents with  . Drug Overdose    The history is provided by the patient. No language interpreter was used.     HPI Comments: Brandy Sherman is a 25 y.o. female brought in by EMS, with PMHx significant for substance abuse, who presents to the Emergency Department complaining of drug overdose occurring shortly PTA. Pt reports she injected heroine and also used crack and cocaine tonight. Pt notes some palpitations currently. She denies SI or any other associated symptoms.  PCP: Dr. Donnetta SimpersYousef  Past Medical History:  Diagnosis Date  . Anxiety   . Clotting disorder (HCC)   . Hepatitis C virus   . HSV-2 (herpes simplex virus 2) infection   . STD (sexually transmitted disease)    HSV II  . 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 Dysmenorrhea 12/07/2015  . 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 Hepatitis, viral 12/07/2015  . History of substance abuse 12/07/2015    Past Surgical History:  Procedure Laterality Date  . arm surgery Right   . WRIST SURGERY Left    abcess     OB History    Gravida Para Term Preterm AB Living   0 0 0 0 0 0   SAB TAB Ectopic Multiple Live Births   0 0 0 0 0       Home Medications    Prior to Admission medications   Medication Sig Start Date End Date Taking? Authorizing Provider  citalopram (CELEXA) 40 MG tablet Take 40 mg by mouth every morning. 11/15/15   Historical Provider, MD  gabapentin (NEURONTIN) 300 MG capsule Take 300 mg by mouth 2 (two) times daily. 11/02/15   Historical  Provider, MD  Levonorgestrel-Ethinyl Estradiol (LOSEASONIQUE) 0.1-0.02 & 0.01 MG tablet Take 1 tablet by mouth daily. 12/07/15   Romualdo BolkJill Evelyn Jertson, MD  misoprostol (CYTOTEC) 200 MCG tablet Place 2 tablets intravaginally 6-12 hours prior to IUD insertion 12/20/15   Romualdo BolkJill Evelyn Jertson, MD  naltrexone (DEPADE) 50 MG tablet Take 50 mg by mouth every morning. 11/11/15   Historical Provider, MD  naproxen sodium (ANAPROX DS) 550 MG tablet Take 1 tablet (550 mg total) by mouth 2 (two) times daily with a meal. 12/07/15   Romualdo BolkJill Evelyn Jertson, MD  traZODone (DESYREL) 100 MG tablet Take 100 mg by mouth at bedtime. 11/05/15   Historical Provider, MD  valACYclovir (VALTREX) 500 MG tablet Take 1 tablet po bid x 3 days prn 12/07/15   Romualdo BolkJill Evelyn Jertson, MD    Family History Family History  Problem Relation Age of Onset  . Melanoma Mother   . Hypertension Father   . Hyperlipidemia Father   . Diabetes Maternal Grandmother   . Diabetes Maternal Grandfather     Social History Social History  Substance Use Topics  . Smoking status: Never Smoker  . Smokeless tobacco: Never Used  . Alcohol use No     Allergies   Review of patient's allergies indicates no known allergies.   Review of Systems Review of Systems  Cardiovascular: Positive  for palpitations.  Psychiatric/Behavioral: Negative for suicidal ideas.  All other systems reviewed and are negative.   Physical Exam Updated Vital Signs BP 128/74 (BP Location: Left Arm)   Pulse (!) 127   Temp 100.5 F (38.1 C) (Oral)   Resp 18   Ht 5\' 6"  (1.676 m)   Wt 160 lb (72.6 kg)   LMP 12/01/2015   SpO2 92%   BMI 25.82 kg/m   Physical Exam  Constitutional: She is oriented to person, place, and time. She appears well-developed and well-nourished.  HENT:  Head: Normocephalic and atraumatic.  Eyes: EOM are normal. Pupils are equal, round, and reactive to light.  Neck: Normal range of motion. Neck supple. No JVD present.  Cardiovascular: Normal rate,  regular rhythm and normal heart sounds.   No murmur heard. Pulmonary/Chest: Effort normal and breath sounds normal. She has no wheezes. She has no rales. She exhibits no tenderness.  Abdominal: Soft. Bowel sounds are normal. She exhibits no distension and no mass. There is no tenderness.  Musculoskeletal: Normal range of motion. She exhibits no edema.  Lymphadenopathy:    She has no cervical adenopathy.  Neurological: She is alert and oriented to person, place, and time. No cranial nerve deficit. She exhibits normal muscle tone. Coordination normal.  Skin: Skin is warm and dry. No rash noted.  Multiple needle tracks present.  Psychiatric: She has a normal mood and affect. Her behavior is normal. Judgment and thought content normal.  Nursing note and vitals reviewed.   ED Treatments / Results  Labs (all labs ordered are listed, but only abnormal results are displayed) Labs Reviewed  COMPREHENSIVE METABOLIC PANEL - Abnormal; Notable for the following:       Result Value   Glucose, Bld 241 (*)    Creatinine, Ser 1.07 (*)    Calcium 8.6 (*)    AST 52 (*)    ALT 72 (*)    Alkaline Phosphatase 37 (*)    All other components within normal limits  ACETAMINOPHEN LEVEL - Abnormal; Notable for the following:    Acetaminophen (Tylenol), Serum <10 (*)    All other components within normal limits  CBC - Abnormal; Notable for the following:    Hemoglobin 11.9 (*)    All other components within normal limits  RAPID URINE DRUG SCREEN, HOSP PERFORMED - Abnormal; Notable for the following:    Opiates POSITIVE (*)    Cocaine POSITIVE (*)    All other components within normal limits  CBG MONITORING, ED - Abnormal; Notable for the following:    Glucose-Capillary 237 (*)    All other components within normal limits  ETHANOL  SALICYLATE LEVEL    EKG  EKG Interpretation  Date/Time:  Tuesday January 17 2016 01:02:57 EDT Ventricular Rate:  124 PR Interval:    QRS Duration: 89 QT  Interval:  331 QTC Calculation: 476 R Axis:   63 Text Interpretation:  Age not entered, assumed to be  25 years old for purpose of ECG interpretation Sinus tachycardia Probable left atrial enlargement Low voltage, precordial leads Borderline prolonged QT interval No old tracing to compare Confirmed by [facility]  MD, Ayleah Hofmeister ([id number]) on 01/17/2016 1:19:43 AM       Procedures Procedures (including critical care time) CRITICAL CARE Performed by: UEAVW,UJWJX Total critical care time: 45 minutes Critical care time was exclusive of separately billable procedures and treating other patients. Critical care was necessary to treat or prevent imminent or life-threatening deterioration. Critical care was time spent personally  by me on the following activities: development of treatment plan with patient and/or surrogate as well as nursing, discussions with consultants, evaluation of patient's response to treatment, examination of patient, obtaining history from patient or surrogate, ordering and performing treatments and interventions, ordering and review of laboratory studies, ordering and review of radiographic studies, pulse oximetry and re-evaluation of patient's condition.  DIAGNOSTIC STUDIES: Oxygen Saturation is 92% on RA, low by my interpretation.    COORDINATION OF CARE: 1:24 AM Discussed treatment plan with pt at bedside and pt agreed to plan.  Medications Ordered in ED Medications - No data to display   Initial Impression / Assessment and Plan / ED Course  I have reviewed the triage vital signs and the nursing notes.  Pertinent labs & imaging results that were available during my care of the patient were reviewed by me and considered in my medical decision making (see chart for details).  Clinical Course   26 year old female comes in following accidental overdose of heroin. She was treated with naloxone at the scene and arrives so alert and awake and with adequate oxygen saturation. It was  elected to observe her in the ED. She continued to be awake and alert and never required additional naloxone. She was observed for 4 hours without any deterioration. He was felt at that point that she was safe for discharge. Old records were reviewed, and there are no relevant past visits. She is discharged with the resource guide for substance abuse treatment centers.  I personally performed the services described in this documentation, which was scribed in my presence. The recorded information has been reviewed and is accurate.   Final Clinical Impressions(s) / ED Diagnoses   Final diagnoses:  Accidental overdose of heroin, initial encounter    New Prescriptions New Prescriptions   No medications on file     Dione Booze, MD 01/17/16 0500    Dione Booze, MD 01/17/16 1610

## 2016-01-18 ENCOUNTER — Emergency Department (HOSPITAL_COMMUNITY): Payer: PRIVATE HEALTH INSURANCE

## 2016-01-18 ENCOUNTER — Encounter (HOSPITAL_COMMUNITY): Payer: Self-pay | Admitting: Emergency Medicine

## 2016-01-18 ENCOUNTER — Inpatient Hospital Stay (HOSPITAL_COMMUNITY)
Admission: EM | Admit: 2016-01-18 | Discharge: 2016-01-26 | DRG: 917 | Disposition: A | Discharge status: Home / Self Care | Payer: PRIVATE HEALTH INSURANCE | Attending: Internal Medicine | Admitting: Internal Medicine

## 2016-01-18 DIAGNOSIS — R402312 Coma scale, best motor response, none, at arrival to emergency department: Secondary | ICD-10-CM | POA: Diagnosis present

## 2016-01-18 DIAGNOSIS — Z0189 Encounter for other specified special examinations: Secondary | ICD-10-CM

## 2016-01-18 DIAGNOSIS — T402X1A Poisoning by other opioids, accidental (unintentional), initial encounter: Secondary | ICD-10-CM

## 2016-01-18 DIAGNOSIS — G40901 Epilepsy, unspecified, not intractable, with status epilepticus: Secondary | ICD-10-CM | POA: Diagnosis present

## 2016-01-18 DIAGNOSIS — T405X1A Poisoning by cocaine, accidental (unintentional), initial encounter: Secondary | ICD-10-CM | POA: Diagnosis present

## 2016-01-18 DIAGNOSIS — R739 Hyperglycemia, unspecified: Secondary | ICD-10-CM | POA: Diagnosis present

## 2016-01-18 DIAGNOSIS — B9620 Unspecified Escherichia coli [E. coli] as the cause of diseases classified elsewhere: Secondary | ICD-10-CM | POA: Diagnosis present

## 2016-01-18 DIAGNOSIS — R569 Unspecified convulsions: Secondary | ICD-10-CM

## 2016-01-18 DIAGNOSIS — T401X4A Poisoning by heroin, undetermined, initial encounter: Secondary | ICD-10-CM | POA: Diagnosis not present

## 2016-01-18 DIAGNOSIS — B1920 Unspecified viral hepatitis C without hepatic coma: Secondary | ICD-10-CM | POA: Diagnosis present

## 2016-01-18 DIAGNOSIS — T401X1A Poisoning by heroin, accidental (unintentional), initial encounter: Principal | ICD-10-CM | POA: Diagnosis present

## 2016-01-18 DIAGNOSIS — Z833 Family history of diabetes mellitus: Secondary | ICD-10-CM | POA: Diagnosis not present

## 2016-01-18 DIAGNOSIS — A6000 Herpesviral infection of urogenital system, unspecified: Secondary | ICD-10-CM | POA: Diagnosis present

## 2016-01-18 DIAGNOSIS — T50904A Poisoning by unspecified drugs, medicaments and biological substances, undetermined, initial encounter: Secondary | ICD-10-CM | POA: Diagnosis not present

## 2016-01-18 DIAGNOSIS — G92 Toxic encephalopathy: Secondary | ICD-10-CM | POA: Diagnosis present

## 2016-01-18 DIAGNOSIS — F1410 Cocaine abuse, uncomplicated: Secondary | ICD-10-CM | POA: Diagnosis present

## 2016-01-18 DIAGNOSIS — N179 Acute kidney failure, unspecified: Secondary | ICD-10-CM | POA: Diagnosis present

## 2016-01-18 DIAGNOSIS — E876 Hypokalemia: Secondary | ICD-10-CM | POA: Diagnosis present

## 2016-01-18 DIAGNOSIS — R402112 Coma scale, eyes open, never, at arrival to emergency department: Secondary | ICD-10-CM | POA: Diagnosis present

## 2016-01-18 DIAGNOSIS — F419 Anxiety disorder, unspecified: Secondary | ICD-10-CM | POA: Diagnosis present

## 2016-01-18 DIAGNOSIS — Z79899 Other long term (current) drug therapy: Secondary | ICD-10-CM | POA: Diagnosis not present

## 2016-01-18 DIAGNOSIS — R402212 Coma scale, best verbal response, none, at arrival to emergency department: Secondary | ICD-10-CM | POA: Diagnosis present

## 2016-01-18 DIAGNOSIS — R34 Anuria and oliguria: Secondary | ICD-10-CM | POA: Diagnosis present

## 2016-01-18 DIAGNOSIS — Z23 Encounter for immunization: Secondary | ICD-10-CM

## 2016-01-18 DIAGNOSIS — G259 Extrapyramidal and movement disorder, unspecified: Secondary | ICD-10-CM | POA: Diagnosis present

## 2016-01-18 DIAGNOSIS — R7989 Other specified abnormal findings of blood chemistry: Secondary | ICD-10-CM | POA: Diagnosis not present

## 2016-01-18 DIAGNOSIS — Z8249 Family history of ischemic heart disease and other diseases of the circulatory system: Secondary | ICD-10-CM | POA: Diagnosis not present

## 2016-01-18 DIAGNOSIS — T50901A Poisoning by unspecified drugs, medicaments and biological substances, accidental (unintentional), initial encounter: Secondary | ICD-10-CM | POA: Diagnosis present

## 2016-01-18 DIAGNOSIS — F332 Major depressive disorder, recurrent severe without psychotic features: Secondary | ICD-10-CM | POA: Diagnosis present

## 2016-01-18 DIAGNOSIS — T434X5A Adverse effect of butyrophenone and thiothixene neuroleptics, initial encounter: Secondary | ICD-10-CM | POA: Diagnosis not present

## 2016-01-18 DIAGNOSIS — J9601 Acute respiratory failure with hypoxia: Secondary | ICD-10-CM | POA: Diagnosis present

## 2016-01-18 DIAGNOSIS — N390 Urinary tract infection, site not specified: Secondary | ICD-10-CM | POA: Diagnosis present

## 2016-01-18 DIAGNOSIS — Z8489 Family history of other specified conditions: Secondary | ICD-10-CM | POA: Diagnosis not present

## 2016-01-18 DIAGNOSIS — R410 Disorientation, unspecified: Secondary | ICD-10-CM

## 2016-01-18 DIAGNOSIS — T50904D Poisoning by unspecified drugs, medicaments and biological substances, undetermined, subsequent encounter: Secondary | ICD-10-CM | POA: Diagnosis not present

## 2016-01-18 DIAGNOSIS — J690 Pneumonitis due to inhalation of food and vomit: Secondary | ICD-10-CM

## 2016-01-18 DIAGNOSIS — G2409 Other drug induced dystonia: Secondary | ICD-10-CM | POA: Diagnosis not present

## 2016-01-18 DIAGNOSIS — R413 Other amnesia: Secondary | ICD-10-CM

## 2016-01-18 LAB — I-STAT ARTERIAL BLOOD GAS, ED
Acid-base deficit: 2 mmol/L (ref 0.0–2.0)
BICARBONATE: 23.6 mmol/L (ref 20.0–28.0)
O2 Saturation: 99 %
PCO2 ART: 42.4 mmHg (ref 32.0–48.0)
PH ART: 7.352 (ref 7.350–7.450)
PO2 ART: 129 mmHg — AB (ref 83.0–108.0)
Patient temperature: 98.6
TCO2: 25 mmol/L (ref 0–100)

## 2016-01-18 LAB — RAPID URINE DRUG SCREEN, HOSP PERFORMED
AMPHETAMINES: NOT DETECTED
Barbiturates: NOT DETECTED
Benzodiazepines: POSITIVE — AB
Cocaine: POSITIVE — AB
Opiates: NOT DETECTED
TETRAHYDROCANNABINOL: NOT DETECTED

## 2016-01-18 LAB — CBG MONITORING, ED: GLUCOSE-CAPILLARY: 307 mg/dL — AB (ref 65–99)

## 2016-01-18 LAB — CBC WITH DIFFERENTIAL/PLATELET
Basophils Absolute: 0 10*3/uL (ref 0.0–0.1)
Basophils Relative: 0 %
EOS PCT: 0 %
Eosinophils Absolute: 0 10*3/uL (ref 0.0–0.7)
HCT: 35 % — ABNORMAL LOW (ref 36.0–46.0)
HEMOGLOBIN: 11.6 g/dL — AB (ref 12.0–15.0)
LYMPHS ABS: 0.5 10*3/uL — AB (ref 0.7–4.0)
LYMPHS PCT: 4 %
MCH: 29.1 pg (ref 26.0–34.0)
MCHC: 33.1 g/dL (ref 30.0–36.0)
MCV: 87.9 fL (ref 78.0–100.0)
MONOS PCT: 4 %
Monocytes Absolute: 0.5 10*3/uL (ref 0.1–1.0)
Neutro Abs: 10.4 10*3/uL — ABNORMAL HIGH (ref 1.7–7.7)
Neutrophils Relative %: 92 %
PLATELETS: 228 10*3/uL (ref 150–400)
RBC: 3.98 MIL/uL (ref 3.87–5.11)
RDW: 14.2 % (ref 11.5–15.5)
WBC: 11.4 10*3/uL — AB (ref 4.0–10.5)

## 2016-01-18 LAB — COMPREHENSIVE METABOLIC PANEL
ALBUMIN: 3.3 g/dL — AB (ref 3.5–5.0)
ALK PHOS: 62 U/L (ref 38–126)
ALT: 73 U/L — ABNORMAL HIGH (ref 14–54)
AST: 64 U/L — AB (ref 15–41)
Anion gap: 11 (ref 5–15)
BILIRUBIN TOTAL: 0.5 mg/dL (ref 0.3–1.2)
BUN: 6 mg/dL (ref 6–20)
CO2: 21 mmol/L — ABNORMAL LOW (ref 22–32)
Calcium: 8.1 mg/dL — ABNORMAL LOW (ref 8.9–10.3)
Chloride: 104 mmol/L (ref 101–111)
Creatinine, Ser: 1.27 mg/dL — ABNORMAL HIGH (ref 0.44–1.00)
GFR calc Af Amer: >60 mL/min (ref >60)
GFR calc non Af Amer: 59 mL/min — ABNORMAL LOW (ref >60)
GLUCOSE: 241 mg/dL — AB (ref 65–99)
Potassium: 3.9 mmol/L (ref 3.5–5.1)
Sodium: 136 mmol/L (ref 135–145)
Total Protein: 6.7 g/dL (ref 6.5–8.1)

## 2016-01-18 LAB — I-STAT BETA HCG BLOOD, ED (MC, WL, AP ONLY): I-stat hCG, quantitative: <5 m[IU]/mL (ref <5)

## 2016-01-18 MED ORDER — IOPAMIDOL (ISOVUE-300) INJECTION 61%
INTRAVENOUS | Status: AC
  Filled 2016-01-18: qty 75

## 2016-01-18 MED ORDER — NALOXONE HCL 2 MG/2ML IJ SOSY
PREFILLED_SYRINGE | INTRAMUSCULAR | Status: AC
  Administered 2016-01-18: 2 mg
  Filled 2016-01-18: qty 2

## 2016-01-18 MED ORDER — ROCURONIUM BROMIDE 50 MG/5ML IV SOLN
INTRAVENOUS | Status: AC | PRN
  Administered 2016-01-18: 50 mg via INTRAVENOUS

## 2016-01-18 MED ORDER — LORAZEPAM 2 MG/ML IJ SOLN
2.0000 mg | Freq: Once | INTRAMUSCULAR | Status: AC
  Administered 2016-01-18: 2 mg via INTRAVENOUS
  Filled 2016-01-18: qty 1

## 2016-01-18 MED ORDER — PROPOFOL 1000 MG/100ML IV EMUL
5.0000 ug/kg/min | INTRAVENOUS | Status: DC
  Administered 2016-01-18: 5 ug/kg/min via INTRAVENOUS
  Administered 2016-01-19: 35 ug/kg/min via INTRAVENOUS
  Filled 2016-01-18 (×3): qty 100

## 2016-01-18 MED ORDER — ETOMIDATE 2 MG/ML IV SOLN
INTRAVENOUS | Status: AC | PRN
  Administered 2016-01-18: 20 mg via INTRAVENOUS

## 2016-01-18 MED ORDER — SODIUM CHLORIDE 0.9 % IV BOLUS (SEPSIS)
1000.0000 mL | Freq: Once | INTRAVENOUS | Status: AC
  Administered 2016-01-18: 1000 mL via INTRAVENOUS

## 2016-01-18 MED ORDER — IOPAMIDOL (ISOVUE-300) INJECTION 61%
INTRAVENOUS | Status: AC
  Administered 2016-01-18: 75 mL
  Filled 2016-01-18: qty 75

## 2016-01-18 MED ORDER — LORAZEPAM 2 MG/ML IJ SOLN
INTRAMUSCULAR | Status: AC
  Filled 2016-01-18: qty 1

## 2016-01-18 NOTE — Code Documentation (Signed)
7.5 ET tube placed at this time.  Noted to be 23 cm at the lip line.  Secured and + color change noted.  Bilateral breath sounds noted by Dr. Hyacinth MeekerMiller post insertion.  PCXR order placed to verify placement.

## 2016-01-18 NOTE — ED Triage Notes (Signed)
Per EMS, pt heroin overdose and seizure post Narcan. Pt received 2mg  narcan intranasal, pt experienced decerebrate posturing for 4-5 minutes. Given 5mg  versed IM. Experienced another episode of decerebrate posturing for 2 minutes, received another 2.5 mg IM versed. No posturing since. Pt has IO access in the left leg.

## 2016-01-18 NOTE — ED Provider Notes (Signed)
The patient is a 25 year old female, she has a known history of substance abuse, she uses injection IV drugs, she has also admitted to using crack cocaine yesterday when she was seen for similar findings when she had overdosed on heroin evidently is an accidental overdose. Today she presents by ambulance after she was found unresponsive slumped over in a car, she was apneic, she was given Narcan at the scene through intraosseous access, there was some return of spontaneous respirations but the patient's mental status was GCS of 3 on arrival. On exam the patient is having some difficulty with adequate respirations, she has a nasal trumpet, she has normal pulses, she has a soft abdomen, she has bruising over her bilateral chest wall as well as bruises in different stages of healing over her bilateral legs as well as multiple track marks over bilateral antecubital fossa as well as over her bilateral feet and wrists. She even has track marks over her external jugular on the left. The patient is obtunded, she did receive multiple doses of benzodiazepines because of seizures that occurred after Narcan was given. The seizures stopped spontaneously with those medications. The patient is unable to give any history.  The patient is critically ill  The patient will need further workup, imaging of her brain, cervical spine and her chest given the signs of bruising over the chest wall is we are unsure what led to these injuries. I anticipate that the patient will need to be admitted to the hospital, there is concern for possible anoxic brain injury, seizures could be from trauma, bleeding, she will need to be immobilized in a cervical collar.  The patient did not respond well to medications, she required multiple doses of Narcan, she became stiff agitated combative and had posturing which was very abnormal. Because of this and her inability to go to CT scan without significant movement and artifact and compliance with  procedure the patient was intubated. Her oxygen saturations prior to intubation can need continued to be 90% on nasal cannula, she was clenching her teeth, we were unable to assess her posterior pharynx, her GCS was significantly depressed requiring maintenance of an airway especially if she was leaving the emergency department. She was successfully intubated without difficulty, kept in cervical spine immobilization and sent to CT scan.  I saw and evaluated the patient, reviewed the resident's note and I agree with the findings and plan.     I supervised the resident doing the following procedures - I participated with procedure.  Intubation.     CRITICAL CARE Performed by: Vida RollerBrian D Danyal Whitenack Total critical care time: 35 minutes Critical care time was exclusive of separately billable procedures and treating other patients. Critical care was necessary to treat or prevent imminent or life-threatening deterioration. Critical care was time spent personally by me on the following activities: development of treatment plan with patient and/or surrogate as well as nursing, discussions with consultants, evaluation of patient's response to treatment, examination of patient, obtaining history from patient or surrogate, ordering and performing treatments and interventions, ordering and review of laboratory studies, ordering and review of radiographic studies, pulse oximetry and re-evaluation of patient's condition.  Final diagnoses:  Seizure (HCC)  Diacetylmorphine overdose, undetermined intent, initial encounter   I saw and evaluated the patient, reviewed the resident's note and I agree with the findings and plan.      Eber HongBrian Rigo Letts, MD 01/21/16 [phone]

## 2016-01-18 NOTE — Progress Notes (Signed)
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

## 2016-01-18 NOTE — Procedures (Signed)
Intubation Procedure Note Tereasa CoopHannah Glassburn [id number] [birth year]  Procedure: Intubation Indications: Airway protection and maintenance  Procedure Details Consent: Unable to obtain consent because of altered level of consciousness. Time Out: Verified patient identification, verified procedure, site/side was marked, verified correct patient position, special equipment/implants available, medications/allergies/relevent history reviewed, required imaging and test results available.  Performed  Maximum sterile technique was used including gloves.  MAC    Evaluation Hemodynamic Status: BP stable throughout; O2 sats: stable throughout Patient's Current Condition: stable Complications: No apparent complications Patient did tolerate procedure well. Chest X-ray ordered to verify placement.  CXR: pending.   Rushie ChestnutReid, Charline Hoskinson [facility] 01/18/2016

## 2016-01-18 NOTE — ED Provider Notes (Signed)
[facility]-EMERGENCY DEPT Provider Note   CSN: [id number] Arrival date & time: 01/18/16  1942     History   Chief Complaint Chief Complaint  Patient presents with  . Drug Overdose  . Seizures    HPI Brandy Sherman is a 25 y.o. female.  The history is provided by the EMS personnel. The history is limited by the condition of the patient (obtunded).  Altered Mental Status   This is a new problem. The current episode started less than 1 hour ago. The problem has not changed since onset.Associated symptoms include confusion, somnolence, seizures and unresponsiveness. Risk factors include illicit drug use (heroin and cocaine). Past medical history comments: unknown.    Past Medical History:  Diagnosis Date  . Anxiety   . Clotting disorder (HCC)   . Hepatitis C virus   . HSV-2 (herpes simplex virus 2) infection   . STD (sexually transmitted disease)    HSV II  . 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 Overdose 01/19/2016  . Acute hypoxemic respiratory failure (HCC)   . Cocaine abuse   . AKI (acute kidney injury) (HCC)   . Dysmenorrhea 12/07/2015  . 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 Hepatitis, viral 12/07/2015  . History of substance abuse 12/07/2015    Past Surgical History:  Procedure Laterality Date  . arm surgery Right   . WRIST SURGERY Left    abcess     OB History    Gravida Para Term Preterm AB Living   0 0 0 0 0 0   SAB TAB Ectopic Multiple Live Births   0 0 0 0 0       Home Medications    Prior to Admission medications   Medication Sig Start Date End Date Taking? Authorizing Provider  citalopram (CELEXA) 20 MG tablet Take 20 mg by mouth daily.    Historical Provider, MD  gabapentin (NEURONTIN) 100 MG capsule Take 100 mg by mouth 2 (two) times daily.    Historical Provider, MD  Levonorgestrel (KYLEENA) 19.5 MG IUD 1 each by Intrauterine route continuous.    Historical  Provider, MD  naltrexone (DEPADE) 50 MG tablet Take 50 mg by mouth every morning. 11/11/15   Historical Provider, MD  naproxen sodium (ANAPROX DS) 550 MG tablet Take 1 tablet (550 mg total) by mouth 2 (two) times daily with a meal. 12/07/15   Romualdo Bolk, MD  traZODone (DESYREL) 100 MG tablet Take 100 mg by mouth at bedtime. 11/05/15   Historical Provider, MD  valACYclovir (VALTREX) 500 MG tablet Take 1 tablet po bid x 3 days prn Patient taking differently: Take 500 mg by mouth 3 (three) times daily as needed (flare up.). Take 1 tablet po bid x 3 days prn 12/07/15   Romualdo Bolk, MD    Family History Family History  Problem Relation Age of Onset  . Melanoma Mother   . Hypertension Father   . Hyperlipidemia Father   . Diabetes Maternal Grandmother   . Diabetes Maternal Grandfather     Social History Social History  Substance Use Topics  . Smoking status: Never Smoker  . Smokeless tobacco: Never Used  . Alcohol use No     Allergies   Review of patient's allergies indicates no known allergies.   Review of Systems Review of Systems  Unable to perform ROS: Patient unresponsive  Neurological: Positive for  seizures.  Psychiatric/Behavioral: Positive for confusion.     Physical Exam Updated Vital Signs BP 133/81   Pulse 88   Resp 15   LMP 12/01/2015 Comment: i shielded patient  SpO2 100%   Physical Exam  Constitutional: She appears well-developed and well-nourished.  Obtunded, unresponsive to any noxious stimuli, GCS 3.   HENT:  Head: Normocephalic and atraumatic.  Bit tongue  Eyes: Conjunctivae are normal. Pupils are equal, round, and reactive to light. No scleral icterus.  Pupils 6 mm b/l   Neck: No JVD present. No tracheal deviation present.  Track marks on neck, no neck ecchymoses or hematomas  Cardiovascular: Regular rhythm and intact distal pulses.   Tachycardic to 130's  Pulmonary/Chest: Effort normal and breath sounds normal.  Shallow breathing    Abdominal: Soft. She exhibits no distension.  Musculoskeletal: She exhibits no edema or deformity.  Diffuse scattered bruises and track marks across entire body in various stages of healing  Neurological: GCS eye subscore is 1. GCS verbal subscore is 1. GCS motor subscore is 1.  Skin: Skin is warm. Capillary refill takes 2 to 3 seconds. No rash noted. She is diaphoretic. No pallor.  Nursing note and vitals reviewed.    ED Treatments / Results  Labs (all labs ordered are listed, but only abnormal results are displayed) Labs Reviewed  COMPREHENSIVE METABOLIC PANEL - Abnormal; Notable for the following:       Result Value   CO2 21 (*)    Glucose, Bld 241 (*)    Creatinine, Ser 1.27 (*)    Calcium 8.1 (*)    Albumin 3.3 (*)    AST 64 (*)    ALT 73 (*)    GFR calc non Af Amer 59 (*)    All other components within normal limits  CBC WITH DIFFERENTIAL/PLATELET - Abnormal; Notable for the following:    WBC 11.4 (*)    Hemoglobin 11.6 (*)    HCT 35.0 (*)    Neutro Abs 10.4 (*)    Lymphs Abs 0.5 (*)    All other components within normal limits  RAPID URINE DRUG SCREEN, HOSP PERFORMED - Abnormal; Notable for the following:    Cocaine POSITIVE (*)    Benzodiazepines POSITIVE (*)    All other components within normal limits  CBG MONITORING, ED - Abnormal; Notable for the following:    Glucose-Capillary 307 (*)    All other components within normal limits  I-STAT ARTERIAL BLOOD GAS, ED - Abnormal; Notable for the following:    pO2, Arterial 129.0 (*)    All other components within normal limits  CULTURE, BLOOD (ROUTINE X 2)  CULTURE, BLOOD (ROUTINE X 2)  CULTURE, RESPIRATORY (NON-EXPECTORATED)  ACETAMINOPHEN LEVEL  SALICYLATE LEVEL  CBC  BASIC METABOLIC PANEL  BLOOD GAS, ARTERIAL  MAGNESIUM  PHOSPHORUS  LACTIC ACID, PLASMA  PROCALCITONIN  TROPONIN I  TROPONIN I  TROPONIN I  TRIGLYCERIDES  CALCIUM, IONIZED  OSMOLALITY  HEMOGLOBIN A1C  PREGNANCY, URINE  I-STAT BETA HCG  BLOOD, ED ([facility], [facility], [facility] ONLY)    EKG  EKG Interpretation None       Radiology Ct Head Wo Contrast  Result Date: 01/18/2016 CLINICAL DATA:  25 year old with heroin overdose. Intubated patient. EXAM: CT HEAD WITHOUT CONTRAST CT CERVICAL SPINE WITHOUT CONTRAST TECHNIQUE: Multidetector CT imaging of the head and cervical spine was performed following the standard protocol without intravenous contrast. Multiplanar CT image reconstructions of the cervical spine were also generated. COMPARISON:  None. FINDINGS: CT HEAD  FINDINGS Brain: No evidence for acute hemorrhage, mass lesion, midline shift, hydrocephalus or large infarct. Vascular: No hyperdense vessel or unexpected calcification. Skull: Normal. Negative for fracture or focal lesion. Sinuses/Orbits: Polyp in the right maxillary sinus. The other paranasal sinuses are clear. Other: None CT CERVICAL SPINE FINDINGS Alignment: Normal. Skull base and vertebrae: No acute fracture. No primary bone lesion or focal pathologic process. Soft tissues and spinal canal: Small amount of fluid in the nasopharynx and oropharynx. Endotracheal tube is present. No significant paravertebral soft tissue swelling. Disc levels:  Disc spaces are maintained. Upper chest: Negative. Other: None IMPRESSION: No acute intracranial abnormality. No acute abnormality in cervical spine. Intubated patient. Small amount of fluid in the nasopharynx and oropharynx. Electronically Signed   By: Richarda Overlie M.D.   On: 01/18/2016 21:46   Ct Chest W Contrast  Result Date: 01/18/2016 CLINICAL DATA:  Acute onset of vomiting. Heroin overdose. Seizure after Narcan administration. Initial encounter. EXAM: CT CHEST WITH CONTRAST TECHNIQUE: Multidetector CT imaging of the chest was performed during intravenous contrast administration. CONTRAST:  75mL ISOVUE-300 IOPAMIDOL (ISOVUE-300) INJECTION 61% COMPARISON:  None. FINDINGS: Cardiovascular: The heart is unremarkable in appearance. The thoracic aorta is  grossly unremarkable. No calcific atherosclerotic disease is seen. The great vessels are unremarkable in appearance. Mediastinum/Nodes: The mediastinum is unremarkable in appearance. No mediastinal lymphadenopathy is seen. No pericardial effusion is identified. The patient's endotracheal tube is seen ending 2-3 cm above the carina. The visualized portions of the thyroid gland are unremarkable. No axillary lymphadenopathy is seen. Lungs/Pleura: Patchy bilateral lower lobe airspace opacities are noted, right greater than left. This may reflect aspiration pneumonia, given clinical concern. No pleural effusion or pneumothorax is seen. No masses are identified. Upper Abdomen: The visualized portions of the liver and spleen are grossly unremarkable. A large stone is noted within the gallbladder. The visualized portions of the gallbladder are otherwise unremarkable. The visualized portions of the pancreas, adrenal glands and kidneys are within normal limits. Musculoskeletal: No acute osseous abnormalities are seen. The visualized musculature is grossly unremarkable. IMPRESSION: 1. Patchy bilateral lower lobe airspace opacities, right greater than left. This is suspicious for aspiration pneumonia, given clinical concern. 2. Cholelithiasis.  Gallbladder otherwise unremarkable. Electronically Signed   By: Roanna Raider M.D.   On: 01/18/2016 21:47   Ct Cervical Spine Wo Contrast  Result Date: 01/18/2016 CLINICAL DATA:  25 year old with heroin overdose. Intubated patient. EXAM: CT HEAD WITHOUT CONTRAST CT CERVICAL SPINE WITHOUT CONTRAST TECHNIQUE: Multidetector CT imaging of the head and cervical spine was performed following the standard protocol without intravenous contrast. Multiplanar CT image reconstructions of the cervical spine were also generated. COMPARISON:  None. FINDINGS: CT HEAD FINDINGS Brain: No evidence for acute hemorrhage, mass lesion, midline shift, hydrocephalus or large infarct. Vascular: No  hyperdense vessel or unexpected calcification. Skull: Normal. Negative for fracture or focal lesion. Sinuses/Orbits: Polyp in the right maxillary sinus. The other paranasal sinuses are clear. Other: None CT CERVICAL SPINE FINDINGS Alignment: Normal. Skull base and vertebrae: No acute fracture. No primary bone lesion or focal pathologic process. Soft tissues and spinal canal: Small amount of fluid in the nasopharynx and oropharynx. Endotracheal tube is present. No significant paravertebral soft tissue swelling. Disc levels:  Disc spaces are maintained. Upper chest: Negative. Other: None IMPRESSION: No acute intracranial abnormality. No acute abnormality in cervical spine. Intubated patient. Small amount of fluid in the nasopharynx and oropharynx. Electronically Signed   By: Richarda Overlie M.D.   On: 01/18/2016 21:46  Dg Chest Portable 1 View  Result Date: 01/18/2016 CLINICAL DATA:  Check endotracheal tube placement, recent overdose EXAM: PORTABLE CHEST 1 VIEW COMPARISON:  CT from earlier in the same day FINDINGS: Endotracheal tube is again identified 3 cm above the carina. Cardiac shadow is within normal limits. The patchy changes in the posterior lungs bilaterally are again seen and stable. No sizable effusion is seen. No bony abnormality is noted. IMPRESSION: Bilateral patchy airspace disease similar to that seen on recent CT examination. Endotracheal tube in satisfactory position. Electronically Signed   By: Alcide Clever M.D.   On: 01/18/2016 22:02    Procedures .Intubation Date/Time: 01/19/2016 12:49 AM Performed by: Horald Pollen Authorized by: Horald Pollen   Consent:    Consent obtained:  Emergent situation Pre-procedure details:    Patient status:  Unresponsive   Mallampati score:  I   Pretreatment meds: etomidate.   Paralytics:  Rocuronium Procedure details:    Preoxygenation:  Nonrebreather mask   Intubation method:  Oral   Oral intubation technique:  Direct   Laryngoscope blade:   Mac 3   Tube size (mm):  7.5   Tube type:  Cuffed   Number of attempts:  1   Cricoid pressure: no     Tube visualized through cords: yes   Placement assessment:    Tube secured with:  Adhesive tape and ETT holder   Breath sounds:  Equal and absent over the epigastrium   Placement verification: chest rise, condensation, CXR verification, direct visualization, equal breath sounds and ETCO2 detector     CXR findings:  ETT in proper place Post-procedure details:    Patient tolerance of procedure:  Tolerated well, no immediate complications   (including critical care time)  Medications Ordered in ED Medications  iopamidol (ISOVUE-300) 61 % injection (not administered)  propofol (DIPRIVAN) 1000 MG/100ML infusion (15 mcg/kg/min  72.6 kg Intravenous Rate/Dose Change 01/18/16 2212)  heparin injection 5,000 Units (not administered)  0.9 %  sodium chloride infusion (not administered)  0.9 %  sodium chloride infusion (not administered)  pantoprazole sodium (PROTONIX) 40 mg/20 mL oral suspension 40 mg (not administered)  fentaNYL (SUBLIMAZE) injection 100 mcg (not administered)  fentaNYL (SUBLIMAZE) injection 100 mcg (not administered)  insulin aspart (novoLOG) injection 0-15 Units (not administered)  piperacillin-tazobactam (ZOSYN) IVPB 3.375 g (not administered)  naloxone (NARCAN) 2 MG/2ML injection (2 mg  Given 01/18/16 2009)  sodium chloride 0.9 % bolus 1,000 mL (0 mLs Intravenous Stopped 01/18/16 2140)  LORazepam (ATIVAN) injection 2 mg (2 mg Intravenous Given 01/18/16 2025)  etomidate (AMIDATE) injection (20 mg Intravenous Given 01/18/16 2045)  rocuronium (ZEMURON) injection (50 mg Intravenous Given 01/18/16 2046)  iopamidol (ISOVUE-300) 61 % injection (75 mLs  Contrast Given 01/18/16 2101)  sodium chloride 0.9 % bolus 1,000 mL (0 mLs Intravenous Stopped 01/18/16 2310)     Initial Impression / Assessment and Plan / ED Course  I have reviewed the triage vital signs and the nursing  notes.  Pertinent labs & imaging results that were available during my care of the patient were reviewed by me and considered in my medical decision making (see chart for details).  Clinical Course   Randee Huston is a 26 y.o. female with h/o cocaine and heroin abuse, seen here yesterday for the same and discharged, who presents to ED via EMS after being found down, unknown time, by friend, who suspects 10 minutes per EMS. Pt was apneic at the scene, bagged, given narcan IO, return of respirations but remained  obtunded, then had stiffening x 5 minutes, concerning for possible seizure per EMS, who subsequently gave Versed. Presented to ED breathing spontaneously but with GCS 3. Respirations again declined, given 2mg  IV Narcan with increase in respirations but remains with GCS 3. Concern not protecting airway, suspect more than opiates on board, concern for some extensor posturing of b/l Le's, concern for diffuse body-wide bruising, including recent-appearing bruising of chest, so intubated and CT scans ordered as above, no traumatic injury. Likely aspiration pneumonitis on CT chest. Pt remains in critical condition, unclear cause of presentation, concern for possible anoxic brain injury, though has reactive pupils. Admitted to ICU for further management.  Pt condition, course, and admission were discussed with attending physician Dr. Eber Hong.    Final Clinical Impressions(s) / ED Diagnoses   Final diagnoses:  Seizure [facility])  Diacetylmorphine overdose, undetermined intent, initial encounter    New Prescriptions New Prescriptions   No medications on file     Horald Pollen, MD 01/19/16 0050    Eber Hong, MD 01/21/16 [phone]

## 2016-01-19 ENCOUNTER — Inpatient Hospital Stay (HOSPITAL_COMMUNITY): Payer: PRIVATE HEALTH INSURANCE

## 2016-01-19 DIAGNOSIS — N179 Acute kidney failure, unspecified: Secondary | ICD-10-CM | POA: Insufficient documentation

## 2016-01-19 DIAGNOSIS — T50901A Poisoning by unspecified drugs, medicaments and biological substances, accidental (unintentional), initial encounter: Secondary | ICD-10-CM | POA: Diagnosis present

## 2016-01-19 DIAGNOSIS — J9601 Acute respiratory failure with hypoxia: Secondary | ICD-10-CM | POA: Diagnosis present

## 2016-01-19 DIAGNOSIS — F1410 Cocaine abuse, uncomplicated: Secondary | ICD-10-CM | POA: Diagnosis present

## 2016-01-19 DIAGNOSIS — R569 Unspecified convulsions: Secondary | ICD-10-CM

## 2016-01-19 DIAGNOSIS — T50904A Poisoning by unspecified drugs, medicaments and biological substances, undetermined, initial encounter: Secondary | ICD-10-CM

## 2016-01-19 LAB — URINALYSIS, ROUTINE W REFLEX MICROSCOPIC
BILIRUBIN URINE: NEGATIVE
Glucose, UA: NEGATIVE mg/dL
Hgb urine dipstick: NEGATIVE
KETONES UR: 15 mg/dL — AB
Leukocytes, UA: NEGATIVE
NITRITE: POSITIVE — AB
PROTEIN: NEGATIVE mg/dL
Specific Gravity, Urine: 1.015 (ref 1.005–1.030)
pH: 5.5 (ref 5.0–8.0)

## 2016-01-19 LAB — TROPONIN I
Troponin I: 0.88 ng/mL (ref <0.03)
Troponin I: 0.85 ng/mL (ref <0.03)
Troponin I: 0.48 ng/mL (ref <0.03)

## 2016-01-19 LAB — BASIC METABOLIC PANEL
Anion gap: 8 (ref 5–15)
BUN: 7 mg/dL (ref 6–20)
CALCIUM: 7.7 mg/dL — AB (ref 8.9–10.3)
CHLORIDE: 107 mmol/L (ref 101–111)
CO2: 23 mmol/L (ref 22–32)
CREATININE: 0.89 mg/dL (ref 0.44–1.00)
GFR calc Af Amer: >60 mL/min (ref >60)
GFR calc non Af Amer: >60 mL/min (ref >60)
Glucose, Bld: 79 mg/dL (ref 65–99)
Potassium: 3.2 mmol/L — ABNORMAL LOW (ref 3.5–5.1)
SODIUM: 138 mmol/L (ref 135–145)

## 2016-01-19 LAB — CBC
HCT: 30.7 % — ABNORMAL LOW (ref 36.0–46.0)
HEMOGLOBIN: 10.1 g/dL — AB (ref 12.0–15.0)
MCH: 28.4 pg (ref 26.0–34.0)
MCHC: 32.9 g/dL (ref 30.0–36.0)
MCV: 86.2 fL (ref 78.0–100.0)
Platelets: 231 10*3/uL (ref 150–400)
RBC: 3.56 MIL/uL — ABNORMAL LOW (ref 3.87–5.11)
RDW: 14.2 % (ref 11.5–15.5)
WBC: 8.7 10*3/uL (ref 4.0–10.5)

## 2016-01-19 LAB — BLOOD GAS, ARTERIAL
Acid-Base Excess: 1.4 mmol/L (ref 0.0–2.0)
Bicarbonate: 24.7 mmol/L (ref 20.0–28.0)
Drawn by: 345601
FIO2: 40
O2 Saturation: 99.6 %
PATIENT TEMPERATURE: 99.6
PCO2 ART: 34.8 mmHg (ref 32.0–48.0)
PEEP: 5 cmH2O
PO2 ART: 178 mmHg — AB (ref 83.0–108.0)
RATE: 15 resp/min
VT: 500 mL
pH, Arterial: 7.467 — ABNORMAL HIGH (ref 7.350–7.450)

## 2016-01-19 LAB — GLUCOSE, CAPILLARY
GLUCOSE-CAPILLARY: 90 mg/dL (ref 65–99)
Glucose-Capillary: 77 mg/dL (ref 65–99)
Glucose-Capillary: 78 mg/dL (ref 65–99)
Glucose-Capillary: 79 mg/dL (ref 65–99)
Glucose-Capillary: 81 mg/dL (ref 65–99)
Glucose-Capillary: 82 mg/dL (ref 65–99)
Glucose-Capillary: 84 mg/dL (ref 65–99)

## 2016-01-19 LAB — SALICYLATE LEVEL: Salicylate Lvl: <7 mg/dL (ref 2.8–30.0)

## 2016-01-19 LAB — LACTIC ACID, PLASMA: LACTIC ACID, VENOUS: 1.1 mmol/L (ref 0.5–1.9)

## 2016-01-19 LAB — URINE MICROSCOPIC-ADD ON: RBC / HPF: NONE SEEN RBC/hpf (ref 0–5)

## 2016-01-19 LAB — PROCALCITONIN: Procalcitonin: 1.95 ng/mL

## 2016-01-19 LAB — ACETAMINOPHEN LEVEL: Acetaminophen (Tylenol), Serum: <10 ug/mL — ABNORMAL LOW (ref 10–30)

## 2016-01-19 LAB — TRIGLYCERIDES: Triglycerides: 33 mg/dL (ref <150)

## 2016-01-19 LAB — PREGNANCY, URINE: PREG TEST UR: NEGATIVE

## 2016-01-19 LAB — OSMOLALITY: Osmolality: 286 mOsm/kg (ref 275–295)

## 2016-01-19 LAB — MAGNESIUM: Magnesium: 1.7 mg/dL (ref 1.7–2.4)

## 2016-01-19 LAB — PHOSPHORUS: PHOSPHORUS: 1.6 mg/dL — AB (ref 2.5–4.6)

## 2016-01-19 MED ORDER — PANTOPRAZOLE SODIUM 40 MG PO PACK
40.0000 mg | PACK | Freq: Every day | ORAL | Status: DC
  Administered 2016-01-19: 40 mg
  Filled 2016-01-19 (×2): qty 20

## 2016-01-19 MED ORDER — HEPARIN SODIUM (PORCINE) 5000 UNIT/ML IJ SOLN
5000.0000 [IU] | Freq: Three times a day (TID) | INTRAMUSCULAR | Status: DC
  Administered 2016-01-19 – 2016-01-22 (×11): 5000 [IU] via SUBCUTANEOUS
  Filled 2016-01-19 (×11): qty 1

## 2016-01-19 MED ORDER — PIPERACILLIN-TAZOBACTAM 3.375 G IVPB
3.3750 g | Freq: Three times a day (TID) | INTRAVENOUS | Status: DC
  Administered 2016-01-19 – 2016-01-23 (×13): 3.375 g via INTRAVENOUS
  Filled 2016-01-19 (×16): qty 50

## 2016-01-19 MED ORDER — FENTANYL CITRATE (PF) 100 MCG/2ML IJ SOLN
100.0000 ug | INTRAMUSCULAR | Status: DC | PRN
  Administered 2016-01-20: 75 ug via INTRAVENOUS

## 2016-01-19 MED ORDER — FENTANYL CITRATE (PF) 100 MCG/2ML IJ SOLN
100.0000 ug | INTRAMUSCULAR | Status: DC | PRN
  Administered 2016-01-19 (×5): 100 ug via INTRAVENOUS
  Filled 2016-01-19 (×6): qty 2

## 2016-01-19 MED ORDER — PNEUMOCOCCAL VAC POLYVALENT 25 MCG/0.5ML IJ INJ
0.5000 mL | INJECTION | INTRAMUSCULAR | Status: AC
  Administered 2016-01-20: 0.5 mL via INTRAMUSCULAR
  Filled 2016-01-19: qty 0.5

## 2016-01-19 MED ORDER — SODIUM CHLORIDE 0.9 % IV SOLN
INTRAVENOUS | Status: DC
  Administered 2016-01-19 (×2): via INTRAVENOUS
  Administered 2016-01-20: 1 mL via INTRAVENOUS
  Administered 2016-01-20 – 2016-01-23 (×5): via INTRAVENOUS

## 2016-01-19 MED ORDER — CHLORHEXIDINE GLUCONATE 0.12% ORAL RINSE (MEDLINE KIT)
15.0000 mL | Freq: Two times a day (BID) | OROMUCOSAL | Status: DC
  Administered 2016-01-19 – 2016-01-20 (×3): 15 mL via OROMUCOSAL

## 2016-01-19 MED ORDER — INSULIN ASPART 100 UNIT/ML ~~LOC~~ SOLN: 0.0000 [IU] | SUBCUTANEOUS | Status: DC

## 2016-01-19 MED ORDER — CHLORHEXIDINE GLUCONATE CLOTH 2 % EX PADS
6.0000 | MEDICATED_PAD | Freq: Every day | CUTANEOUS | Status: AC
  Administered 2016-01-19 – 2016-01-23 (×4): 6 via TOPICAL

## 2016-01-19 MED ORDER — ORAL CARE MOUTH RINSE
15.0000 mL | OROMUCOSAL | Status: DC
  Administered 2016-01-19 – 2016-01-20 (×13): 15 mL via OROMUCOSAL

## 2016-01-19 MED ORDER — DEXMEDETOMIDINE HCL IN NACL 200 MCG/50ML IV SOLN
0.4000 ug/kg/h | INTRAVENOUS | Status: DC
  Administered 2016-01-19: 0.4 ug/kg/h via INTRAVENOUS
  Administered 2016-01-19: 0.8 ug/kg/h via INTRAVENOUS
  Administered 2016-01-19 (×2): 0.7 ug/kg/h via INTRAVENOUS
  Filled 2016-01-19 (×4): qty 50

## 2016-01-19 MED ORDER — SODIUM CHLORIDE 0.9 % IV SOLN: 250.0000 mL | INTRAVENOUS | Status: DC | PRN

## 2016-01-19 MED ORDER — MUPIROCIN 2 % EX OINT
1.0000 "application " | TOPICAL_OINTMENT | Freq: Two times a day (BID) | CUTANEOUS | Status: AC
  Administered 2016-01-19 – 2016-01-23 (×10): 1 via NASAL
  Filled 2016-01-19 (×4): qty 22

## 2016-01-19 NOTE — ED Notes (Signed)
Pt is noted to be restless in bed at this time.  Propofol infusion increased to 35 mcg/kg/min per infusion pump.

## 2016-01-19 NOTE — H&P (Signed)
PULMONARY / CRITICAL CARE MEDICINE   Name: Brandy Sherman MRN: [id number] DOB: Aug 26, 1990    ADMISSION DATE:  01/18/2016 CONSULTATION DATE:  01/18/16  REFERRING MD:  Hyacinth Meeker - EDP  CHIEF COMPLAINT:  AMS  HISTORY OF PRESENT ILLNESS:  Pt is encephelopathic; therefore, this HPI is obtained from chart review. Brandy Sherman is a 25 y.o. female with PMH as outlined below. She presented to [facility] ED on 10/10 with heroin overdose. She responded well to Narcan and was observed for several hours. She remains stable therefore was discharged home.  On 10/11 she was found unresponsive by a friend in her car. EMS was summoned and upon their arrival, patient had questionable seizure-like activity. She was given 2 mg of Versed and patient then had decerebrate posturing. She was given an additional 2 mg Versed with resolution. Prior to this she had also been given Narcan with no response.  When she arrived [facility] ED, she had persistent AMS with GCS 3. She was subsequently intubated for airway protection. UDS was positive for benzos and cocaine. Patient was found to have multiple track marks throughout her body including left neck/EJ, bilateral upper extremities, bilateral lower extremities.  CTs of the head and neck were negative. CT of the chest only demonstrated right lower lobe opacity concerning for aspiration.  PAST MEDICAL HISTORY :  She  has a past medical history of Anxiety; Clotting disorder (HCC); Hepatitis C virus; HSV-2 (herpes simplex virus 2) infection; STD (sexually transmitted disease); and Substance abuse.  PAST SURGICAL HISTORY: She  has a past surgical history that includes Wrist surgery (Left) and arm surgery (Right).  No Known Allergies  No current facility-administered medications on file prior to encounter.    Current Outpatient Prescriptions on File Prior to Encounter  Medication Sig  . citalopram (CELEXA) 20 MG tablet Take 20 mg by mouth daily.  Marland Kitchen gabapentin (NEURONTIN) 100 MG  capsule Take 100 mg by mouth 2 (two) times daily.  . Levonorgestrel (KYLEENA) 19.5 MG IUD 1 each by Intrauterine route continuous.  . naltrexone (DEPADE) 50 MG tablet Take 50 mg by mouth every morning.  . naproxen sodium (ANAPROX DS) 550 MG tablet Take 1 tablet (550 mg total) by mouth 2 (two) times daily with a meal.  . traZODone (DESYREL) 100 MG tablet Take 100 mg by mouth at bedtime.  . valACYclovir (VALTREX) 500 MG tablet Take 1 tablet po bid x 3 days prn (Patient taking differently: Take 500 mg by mouth 3 (three) times daily as needed (flare up.). Take 1 tablet po bid x 3 days prn)    FAMILY HISTORY:  Her indicated that her mother is alive. She indicated that her father is alive. She indicated that the status of her maternal grandmother is unknown. She indicated that the status of her maternal grandfather is unknown.    SOCIAL HISTORY: She  reports that she has never smoked. She has never used smokeless tobacco. She reports that she uses drugs, including Cocaine. She reports that she does not drink alcohol.  REVIEW OF SYSTEMS:  Unable to obtain as patient is encephalopathic.  SUBJECTIVE:  On vent, unresponsive.  VITAL SIGNS: BP 146/74   Pulse (!) 148   Resp (!) 27   LMP 12/01/2015 Comment: i shielded patient  SpO2 100%   HEMODYNAMICS:    VENTILATOR SETTINGS: Vent Mode: PRVC FiO2 (%):  [40 %] 40 % Set Rate:  [15 bmp] 15 bmp Vt Set:  [500 mL] 500 mL PEEP:  [5 cmH20] 5  cmH20 Plateau Pressure:  [16 cmH20] 16 cmH20  INTAKE / OUTPUT: No intake/output data recorded.   PHYSICAL EXAMINATION: General: Young Caucasian female, in NAD. Neuro: Sedated on vent, nonresponsive. HEENT: [place]/AT. Pupils dilated but equally responsive to light., sclerae anicteric. Cardiovascular: RRR, no M/R/G.  Lungs: Respirations even and unlabored.  CTA bilaterally, No W/R/R. Abdomen: BS x 4, soft, NT/ND.  Musculoskeletal: No gross deformities, no edema.  Skin: Multiple track marks throughout body  including in the left neck, bilateral upper extremity's, bilateral lower extremities.  LABS:  BMET  Recent Labs Lab 01/17/16 0130 01/18/16 2001  NA 137 136  K 3.5 3.9  CL 102 104  CO2 23 21*  BUN 13 6  CREATININE 1.07* 1.27*  GLUCOSE 241* 241*    Electrolytes  Recent Labs Lab 01/17/16 0130 01/18/16 2001  CALCIUM 8.6* 8.1*    CBC  Recent Labs Lab 01/17/16 0130 01/18/16 2001  WBC 6.9 11.4*  HGB 11.9* 11.6*  HCT 36.1 35.0*  PLT 240 228    Coag's No results for input(s): APTT, INR in the last 168 hours.  Sepsis Markers No results for input(s): LATICACIDVEN, PROCALCITON, O2SATVEN in the last 168 hours.  ABG  Recent Labs Lab 01/18/16 2149  PHART 7.352  PCO2ART 42.4  PO2ART 129.0*    Liver Enzymes  Recent Labs Lab 01/17/16 0130 01/18/16 2001  AST 52* 64*  ALT 72* 73*  ALKPHOS 37* 62  BILITOT 0.5 0.5  ALBUMIN 4.0 3.3*    Cardiac Enzymes No results for input(s): TROPONINI, PROBNP in the last 168 hours.  Glucose  Recent Labs Lab 01/17/16 0112 01/18/16 1954  GLUCAP 237* 307*    Imaging Ct Head Wo Contrast  Result Date: 01/18/2016 CLINICAL DATA:  25 year old with heroin overdose. Intubated patient. EXAM: CT HEAD WITHOUT CONTRAST CT CERVICAL SPINE WITHOUT CONTRAST TECHNIQUE: Multidetector CT imaging of the head and cervical spine was performed following the standard protocol without intravenous contrast. Multiplanar CT image reconstructions of the cervical spine were also generated. COMPARISON:  None. FINDINGS: CT HEAD FINDINGS Brain: No evidence for acute hemorrhage, mass lesion, midline shift, hydrocephalus or large infarct. Vascular: No hyperdense vessel or unexpected calcification. Skull: Normal. Negative for fracture or focal lesion. Sinuses/Orbits: Polyp in the right maxillary sinus. The other paranasal sinuses are clear. Other: None CT CERVICAL SPINE FINDINGS Alignment: Normal. Skull base and vertebrae: No acute fracture. No primary bone  lesion or focal pathologic process. Soft tissues and spinal canal: Small amount of fluid in the nasopharynx and oropharynx. Endotracheal tube is present. No significant paravertebral soft tissue swelling. Disc levels:  Disc spaces are maintained. Upper chest: Negative. Other: None IMPRESSION: No acute intracranial abnormality. No acute abnormality in cervical spine. Intubated patient. Small amount of fluid in the nasopharynx and oropharynx. Electronically Signed   By: Richarda OverlieAdam  Henn M.D.   On: 01/18/2016 21:46   Ct Chest W Contrast  Result Date: 01/18/2016 CLINICAL DATA:  Acute onset of vomiting. Heroin overdose. Seizure after Narcan administration. Initial encounter. EXAM: CT CHEST WITH CONTRAST TECHNIQUE: Multidetector CT imaging of the chest was performed during intravenous contrast administration. CONTRAST:  75mL ISOVUE-300 IOPAMIDOL (ISOVUE-300) INJECTION 61% COMPARISON:  None. FINDINGS: Cardiovascular: The heart is unremarkable in appearance. The thoracic aorta is grossly unremarkable. No calcific atherosclerotic disease is seen. The great vessels are unremarkable in appearance. Mediastinum/Nodes: The mediastinum is unremarkable in appearance. No mediastinal lymphadenopathy is seen. No pericardial effusion is identified. The patient's endotracheal tube is seen ending 2-3 cm above the carina. The  visualized portions of the thyroid gland are unremarkable. No axillary lymphadenopathy is seen. Lungs/Pleura: Patchy bilateral lower lobe airspace opacities are noted, right greater than left. This may reflect aspiration pneumonia, given clinical concern. No pleural effusion or pneumothorax is seen. No masses are identified. Upper Abdomen: The visualized portions of the liver and spleen are grossly unremarkable. A large stone is noted within the gallbladder. The visualized portions of the gallbladder are otherwise unremarkable. The visualized portions of the pancreas, adrenal glands and kidneys are within normal  limits. Musculoskeletal: No acute osseous abnormalities are seen. The visualized musculature is grossly unremarkable. IMPRESSION: 1. Patchy bilateral lower lobe airspace opacities, right greater than left. This is suspicious for aspiration pneumonia, given clinical concern. 2. Cholelithiasis.  Gallbladder otherwise unremarkable. Electronically Signed   By: Roanna Raider M.D.   On: 01/18/2016 21:47   Ct Cervical Spine Wo Contrast  Result Date: 01/18/2016 CLINICAL DATA:  25 year old with heroin overdose. Intubated patient. EXAM: CT HEAD WITHOUT CONTRAST CT CERVICAL SPINE WITHOUT CONTRAST TECHNIQUE: Multidetector CT imaging of the head and cervical spine was performed following the standard protocol without intravenous contrast. Multiplanar CT image reconstructions of the cervical spine were also generated. COMPARISON:  None. FINDINGS: CT HEAD FINDINGS Brain: No evidence for acute hemorrhage, mass lesion, midline shift, hydrocephalus or large infarct. Vascular: No hyperdense vessel or unexpected calcification. Skull: Normal. Negative for fracture or focal lesion. Sinuses/Orbits: Polyp in the right maxillary sinus. The other paranasal sinuses are clear. Other: None CT CERVICAL SPINE FINDINGS Alignment: Normal. Skull base and vertebrae: No acute fracture. No primary bone lesion or focal pathologic process. Soft tissues and spinal canal: Small amount of fluid in the nasopharynx and oropharynx. Endotracheal tube is present. No significant paravertebral soft tissue swelling. Disc levels:  Disc spaces are maintained. Upper chest: Negative. Other: None IMPRESSION: No acute intracranial abnormality. No acute abnormality in cervical spine. Intubated patient. Small amount of fluid in the nasopharynx and oropharynx. Electronically Signed   By: Richarda Overlie M.D.   On: 01/18/2016 21:46   Dg Chest Portable 1 View  Result Date: 01/18/2016 CLINICAL DATA:  Check endotracheal tube placement, recent overdose EXAM: PORTABLE CHEST  1 VIEW COMPARISON:  CT from earlier in the same day FINDINGS: Endotracheal tube is again identified 3 cm above the carina. Cardiac shadow is within normal limits. The patchy changes in the posterior lungs bilaterally are again seen and stable. No sizable effusion is seen. No bony abnormality is noted. IMPRESSION: Bilateral patchy airspace disease similar to that seen on recent CT examination. Endotracheal tube in satisfactory position. Electronically Signed   By: Alcide Clever M.D.   On: 01/18/2016 22:02     STUDIES:  CT head and neck 10/11 > no acute process. CT chest 10/11 > bilateral lower lobe opacities, right greater than left,.  CULTURES: Blood 10/11 > Sputum 10/11 >  ANTIBIOTICS: Zosyn 10/11 >  SIGNIFICANT EVENTS: 10/11 > admitted with overdose of cocaine. Required intubation due to GCS 3.  LINES/TUBES: ETT 10/11 >  DISCUSSION: 25 y.o. female with history of polysubstance abuse. Seen in [facility] ED 10/10 with heroin overdose, discharged after good response to Narcan. Seen again in [facility] ED 10/11 with overdose presumed due to cocaine. GCS remained 3, therefore required intubation for airway protection.  ASSESSMENT / PLAN:  NEUROLOGIC A:   Polysubstance abuse - seen in [facility] 10/10 for heroin overdose, now seen in [facility] ED for presumed cocaine overdose. UDS noted negative for opiates this admission. Acute encephalopathy -  due to overdose from above. ? Seizure like activity. P:   Sedation:  Propofol gtt / Fentanyl PRN. RASS goal: 0 to -1. Daily WUA. Assess acetaminophen level, salicylate level. Assess EEG, may need neuro consult. Polysubstance abuse counseling once extubated. Social work consult. Hold preadmission citalopram, gabapentin, trazodone.  PULMONARY A: Acute respiratory failure with inability to protect airway in setting of altered mental status due to overdose. Presumed aspiration pneumonia. P:   Full vent support. Wean as able. VAP prevention measures. SBT  in AM if able. Empiric Zosyn. Follow cultures CXR in AM.  CARDIOVASCULAR A:  Cocaine abuse. P:  Trend troponin. Avoid beta blockers.  RENAL A:   AKI. Pseudohypocalcemia - corrects to 8.6. P:   NS @ 100. Assess ionized calcium. Assess osmolality to evaluate for osmole gap. BMP in AM.  GASTROINTESTINAL A:   GI prophylaxis. Nutrition. P:   SUP: Pantoprazole. NPO.  HEMATOLOGIC A:   VTE Prophylaxis. P:  SCD's / heparin. CBC in AM.  INFECTIOUS A:   And aspiration pneumonia. P:   Abx as above (Zosyn).  Follow cultures as above. Assess PCT.  ENDOCRINE A:   Hyperglycemia - no Hx DM. P:   SSI. Assess Hgb A1c.  Family updated: None available.  Interdisciplinary Family Meeting v Palliative Care Meeting:  Due by: 01/26/16.  CC time: 35 minutes.   Rutherford Guys, [place] - [facility] [place] [facility] Pager: [phone]  or [phone] 01/19/2016, 12:00 AM

## 2016-01-19 NOTE — Care Management Note (Signed)
Case Management Note  Patient Details  Name: Brandy Sherman MRN: [id number] Date of Birth: 1991/03/30  Subjective/Objective: Pt admitted on 01/18/16 with cocaine OD.  PTA, pt independent of ADLS and living in a halfway house.                     Action/Plan: Pt currently remains intubated; family at bedside.  Will follow for discharge planning as pt progresses.  Expected Discharge Date:                  Expected Discharge Plan:     In-House Referral:  Clinical Social Work  Discharge planning Services  CM Consult  Post Acute Care Choice:    Choice offered to:     DME Arranged:    DME Agency:     HH Arranged:    HH Agency:     Status of Service:  In process, will continue to follow  If discussed at Long Length of Stay Meetings, dates discussed:    Additional Comments:  Glennon Macmerson, Korry Dalgleish M, RN 01/19/2016, 3:16 PM

## 2016-01-19 NOTE — ED Notes (Signed)
Pt is noted to be more restless in bed at this time.  Pt's left pupil is noted to 6 mm at this time.  Pt's right pupil is noted to be 7 mm and irregularly shaped at this time.  MD aware.

## 2016-01-19 NOTE — Procedures (Signed)
ELECTROENCEPHALOGRAM REPORT  Date of Study: 01/19/2016  Patient's Name: Brandy Sherman MRN: [id number] Date of Birth: [birth year]  Referring Provider: Dr. Rutherford Guysahul Desai  Clinical History: This is a 25 year old woman with altered mental status and seizure-like activity.  Medications: propofol (DIPRIVAN) 1000 MG/100ML infusion  piperacillin-tazobactam (ZOSYN) IVPB 3.375 g   Technical Summary: A multichannel digital EEG recording measured by the international 10-20 system with electrodes applied with paste and impedances below 5000 ohms performed as portable with EKG monitoring in an intubated and sedated patient.  Hyperventilation and photic stimulation were not performed.  The digital EEG was referentially recorded, reformatted, and digitally filtered in a variety of bipolar and referential montages for optimal display.   Description: The patient is intubated and sedated on Propofol during the recording. The background consists of sleep architecture with vertex waves and symmetric sleep spindles With noxious stimulation, there is an increase in faster frequencies. Hyperventilation and photic stimulation were not performed.  There were no epileptiform discharges or electrographic seizures seen.    EKG lead was unremarkable.  Impression: This sedated EEG shows normal sleep architecture.  Clinical Correlation: The absence of epileptiform discharges does not exclude a clinical diagnosis of epilepsy. If further clinical questions remain, repeat wake EEG may be helpful. Clinical correlation is advised.   Patrcia DollyKaren Aquino, M.D.

## 2016-01-19 NOTE — Progress Notes (Addendum)
Pharmacy Antibiotic Note  Brandy Sherman is a 25 y.o. female admitted on 01/18/2016 with heroin overdose/seizure.  Pharmacy has been consulted for Zosyn dosing for likely aspiration PNA, CT and CXR with patchy airspace disease. WBC 11.4, mild bump in Scr, other labs reviewed.   Plan: Zosyn 3.375G IV q8h to be infused over 4 hours Trend WBC, temp, renal function  F/U infectious work-up  No data recorded.   Recent Labs Lab 01/17/16 0130 01/18/16 2001  WBC 6.9 11.4*  CREATININE 1.07* 1.27*    Estimated Creatinine Clearance: 70.9 mL/min (by C-G formula based on SCr of 1.27 mg/dL (H)).    No Known Allergies   Abran DukeLedford, James 01/19/2016 12:13 AM  Addendum: Pharmacy will sign off and only follow peripherally as no dose adjustments are anticipated. Thank you for the consult!  Lysle Pearlachel Hagar Sadiq, PharmD, BCPS 01/19/2016 8:48 AM

## 2016-01-19 NOTE — Progress Notes (Signed)
PULMONARY / CRITICAL CARE MEDICINE   Name: Brandy Sherman MRN: [id number] DOB: 1991/01/04    ADMISSION DATE:  01/18/2016 CONSULTATION DATE:  01/18/16  REFERRING MD:  Hyacinth Meeker - EDP  CHIEF COMPLAINT:  AMS  HISTORY OF PRESENT ILLNESS:  Pt is encephelopathic; therefore, this HPI is obtained from chart review. Brandy Sherman is a 25 y.o. female with PMH as outlined below. She presented to [facility] ED on 10/10 with heroin overdose. She responded well to Narcan and was observed for several hours. She remains stable therefore was discharged home.  On 10/11 she was found unresponsive by a friend in her car. EMS was summoned and upon their arrival, patient had questionable seizure-like activity. She was given 2 mg of Versed and patient then had decerebrate posturing. She was given an additional 2 mg Versed with resolution. Prior to this she had also been given Narcan with no response.  When she arrived [facility] ED, she had persistent AMS with GCS 3. She was subsequently intubated for airway protection. UDS was positive for benzos and cocaine. Patient was found to have multiple track marks throughout her body including left neck/EJ, bilateral upper extremities, bilateral lower extremities.  CTs of the head and neck were negative. CT of the chest only demonstrated right lower lobe opacity concerning for aspiration.  PAST MEDICAL HISTORY :  She  has a past medical history of Anxiety; Clotting disorder (HCC); Hepatitis C virus; HSV-2 (herpes simplex virus 2) infection; STD (sexually transmitted disease); and Substance abuse.  PAST SURGICAL HISTORY: She  has a past surgical history that includes Wrist surgery (Left) and arm surgery (Right).  No Known Allergies  No current facility-administered medications on file prior to encounter.    Current Outpatient Prescriptions on File Prior to Encounter  Medication Sig  . citalopram (CELEXA) 20 MG tablet Take 20 mg by mouth daily.  Marland Kitchen gabapentin (NEURONTIN) 100 MG  capsule Take 100 mg by mouth 2 (two) times daily.  . Levonorgestrel (KYLEENA) 19.5 MG IUD 1 each by Intrauterine route continuous.  . naltrexone (DEPADE) 50 MG tablet Take 50 mg by mouth every morning.  . naproxen sodium (ANAPROX DS) 550 MG tablet Take 1 tablet (550 mg total) by mouth 2 (two) times daily with a meal.  . traZODone (DESYREL) 100 MG tablet Take 100 mg by mouth at bedtime.  . valACYclovir (VALTREX) 500 MG tablet Take 1 tablet po bid x 3 days prn (Patient taking differently: Take 500 mg by mouth 3 (three) times daily as needed (flare up.). Take 1 tablet po bid x 3 days prn)    FAMILY HISTORY:  Her indicated that her mother is alive. She indicated that her father is alive. She indicated that the status of her maternal grandmother is unknown. She indicated that the status of her maternal grandfather is unknown.    SOCIAL HISTORY: She  reports that she has never smoked. She has never used smokeless tobacco. She reports that she uses drugs, including Cocaine. She reports that she does not drink alcohol.  REVIEW OF SYSTEMS:  Unable to obtain as patient is encephalopathic.  SUBJECTIVE:   Intubated, No major events overnight.  VITAL SIGNS: BP 122/75   Pulse 75   Temp 99.6 F (37.6 C) (Axillary)   Resp 15   Ht 5\' 6"  (1.676 m)   Wt 172 lb 6.4 oz (78.2 kg)   LMP 12/01/2015 Comment: i shielded patient  SpO2 100%   BMI 27.83 kg/m   HEMODYNAMICS:    VENTILATOR  SETTINGS: Vent Mode: PRVC FiO2 (%):  [40 %] 40 % Set Rate:  [15 bmp] 15 bmp Vt Set:  [500 mL] 500 mL PEEP:  [5 cmH20] 5 cmH20 Plateau Pressure:  [15 cmH20-16 cmH20] 16 cmH20  INTAKE / OUTPUT: I/O last 3 completed shifts: In: 647 [I.V.:597; IV Piggyback:50] Out: 264 [Urine:264]   PHYSICAL EXAMINATION: General: Young Caucasian female, in no distress Neuro: Sedated on vent, nonresponsive. HEENT: Pupils dilated, responsive Cardiovascular: RRR, no MRG Lungs: Clear, no wheeze or crackles Abdomen: +BS, soft, Non  tender, non distended Musculoskeletal: No gross deformities, no edema.  Skin: Multiple track marks throughout body including in the left neck, bilateral upper extremity's, bilateral lower extremities.  LABS:  BMET  Recent Labs Lab 01/17/16 0130 01/18/16 2001 01/19/16 0602  NA 137 136 138  K 3.5 3.9 3.2*  CL 102 104 107  CO2 23 21* 23  BUN 13 6 7   CREATININE 1.07* 1.27* 0.89  GLUCOSE 241* 241* 79    Electrolytes  Recent Labs Lab 01/17/16 0130 01/18/16 2001 01/19/16 0602  CALCIUM 8.6* 8.1* 7.7*  MG  --   --  1.7  PHOS  --   --  1.6*    CBC  Recent Labs Lab 01/17/16 0130 01/18/16 2001 01/19/16 0602  WBC 6.9 11.4* 8.7  HGB 11.9* 11.6* 10.1*  HCT 36.1 35.0* 30.7*  PLT 240 228 231    Coag's No results for input(s): APTT, INR in the last 168 hours.  Sepsis Markers  Recent Labs Lab 01/19/16 0200  LATICACIDVEN 1.1  PROCALCITON 1.95    ABG  Recent Labs Lab 01/18/16 2149 01/19/16 0514  PHART 7.352 7.467*  PCO2ART 42.4 34.8  PO2ART 129.0* 178*    Liver Enzymes  Recent Labs Lab 01/17/16 0130 01/18/16 2001  AST 52* 64*  ALT 72* 73*  ALKPHOS 37* 62  BILITOT 0.5 0.5  ALBUMIN 4.0 3.3*    Cardiac Enzymes  Recent Labs Lab 01/19/16 0200 01/19/16 0602  TROPONINI 0.88* 0.85*    Glucose  Recent Labs Lab 01/17/16 0112 01/18/16 1954 01/19/16 0208 01/19/16 0450  GLUCAP 237* 307* 81 79    Imaging Ct Head Wo Contrast  Result Date: 01/18/2016 CLINICAL DATA:  25 year old with heroin overdose. Intubated patient. EXAM: CT HEAD WITHOUT CONTRAST CT CERVICAL SPINE WITHOUT CONTRAST TECHNIQUE: Multidetector CT imaging of the head and cervical spine was performed following the standard protocol without intravenous contrast. Multiplanar CT image reconstructions of the cervical spine were also generated. COMPARISON:  None. FINDINGS: CT HEAD FINDINGS Brain: No evidence for acute hemorrhage, mass lesion, midline shift, hydrocephalus or large infarct.  Vascular: No hyperdense vessel or unexpected calcification. Skull: Normal. Negative for fracture or focal lesion. Sinuses/Orbits: Polyp in the right maxillary sinus. The other paranasal sinuses are clear. Other: None CT CERVICAL SPINE FINDINGS Alignment: Normal. Skull base and vertebrae: No acute fracture. No primary bone lesion or focal pathologic process. Soft tissues and spinal canal: Small amount of fluid in the nasopharynx and oropharynx. Endotracheal tube is present. No significant paravertebral soft tissue swelling. Disc levels:  Disc spaces are maintained. Upper chest: Negative. Other: None IMPRESSION: No acute intracranial abnormality. No acute abnormality in cervical spine. Intubated patient. Small amount of fluid in the nasopharynx and oropharynx. Electronically Signed   By: Richarda OverlieAdam  Henn M.D.   On: 01/18/2016 21:46   Ct Chest W Contrast  Result Date: 01/18/2016 CLINICAL DATA:  Acute onset of vomiting. Heroin overdose. Seizure after Narcan administration. Initial encounter. EXAM: CT CHEST WITH CONTRAST  TECHNIQUE: Multidetector CT imaging of the chest was performed during intravenous contrast administration. CONTRAST:  75mL ISOVUE-300 IOPAMIDOL (ISOVUE-300) INJECTION 61% COMPARISON:  None. FINDINGS: Cardiovascular: The heart is unremarkable in appearance. The thoracic aorta is grossly unremarkable. No calcific atherosclerotic disease is seen. The great vessels are unremarkable in appearance. Mediastinum/Nodes: The mediastinum is unremarkable in appearance. No mediastinal lymphadenopathy is seen. No pericardial effusion is identified. The patient's endotracheal tube is seen ending 2-3 cm above the carina. The visualized portions of the thyroid gland are unremarkable. No axillary lymphadenopathy is seen. Lungs/Pleura: Patchy bilateral lower lobe airspace opacities are noted, right greater than left. This may reflect aspiration pneumonia, given clinical concern. No pleural effusion or pneumothorax is seen.  No masses are identified. Upper Abdomen: The visualized portions of the liver and spleen are grossly unremarkable. A large stone is noted within the gallbladder. The visualized portions of the gallbladder are otherwise unremarkable. The visualized portions of the pancreas, adrenal glands and kidneys are within normal limits. Musculoskeletal: No acute osseous abnormalities are seen. The visualized musculature is grossly unremarkable. IMPRESSION: 1. Patchy bilateral lower lobe airspace opacities, right greater than left. This is suspicious for aspiration pneumonia, given clinical concern. 2. Cholelithiasis.  Gallbladder otherwise unremarkable. Electronically Signed   By: Roanna Raider M.D.   On: 01/18/2016 21:47   Ct Cervical Spine Wo Contrast  Result Date: 01/18/2016 CLINICAL DATA:  25 year old with heroin overdose. Intubated patient. EXAM: CT HEAD WITHOUT CONTRAST CT CERVICAL SPINE WITHOUT CONTRAST TECHNIQUE: Multidetector CT imaging of the head and cervical spine was performed following the standard protocol without intravenous contrast. Multiplanar CT image reconstructions of the cervical spine were also generated. COMPARISON:  None. FINDINGS: CT HEAD FINDINGS Brain: No evidence for acute hemorrhage, mass lesion, midline shift, hydrocephalus or large infarct. Vascular: No hyperdense vessel or unexpected calcification. Skull: Normal. Negative for fracture or focal lesion. Sinuses/Orbits: Polyp in the right maxillary sinus. The other paranasal sinuses are clear. Other: None CT CERVICAL SPINE FINDINGS Alignment: Normal. Skull base and vertebrae: No acute fracture. No primary bone lesion or focal pathologic process. Soft tissues and spinal canal: Small amount of fluid in the nasopharynx and oropharynx. Endotracheal tube is present. No significant paravertebral soft tissue swelling. Disc levels:  Disc spaces are maintained. Upper chest: Negative. Other: None IMPRESSION: No acute intracranial abnormality. No acute  abnormality in cervical spine. Intubated patient. Small amount of fluid in the nasopharynx and oropharynx. Electronically Signed   By: Richarda Overlie M.D.   On: 01/18/2016 21:46   Dg Chest Port 1 View  Result Date: 01/19/2016 CLINICAL DATA:  Respiratory failure, overdose, intubated patient. EXAM: PORTABLE CHEST 1 VIEW COMPARISON:  Portable chest x-ray of January 17, 2010 2017. FINDINGS: The lungs are well-expanded. There is hazy increased density that projects over the lower left hemi thorax. The heart and pulmonary vascularity are normal. The endotracheal tube tip lies 3.9 cm above the carina. The esophagogastric tube tip projects below the inferior margin of the image. The bony structures are unremarkable. IMPRESSION: Subtle increased density over the left lower hemi thorax may reflect atelectasis or early pneumonia. It is not greatly changed from the previous study. Stable examination otherwise. Electronically Signed   By: David  Swaziland M.D.   On: 01/19/2016 08:20   Dg Chest Portable 1 View  Result Date: 01/18/2016 CLINICAL DATA:  Check endotracheal tube placement, recent overdose EXAM: PORTABLE CHEST 1 VIEW COMPARISON:  CT from earlier in the same day FINDINGS: Endotracheal tube is again identified 3 cm  above the carina. Cardiac shadow is within normal limits. The patchy changes in the posterior lungs bilaterally are again seen and stable. No sizable effusion is seen. No bony abnormality is noted. IMPRESSION: Bilateral patchy airspace disease similar to that seen on recent CT examination. Endotracheal tube in satisfactory position. Electronically Signed   By: Alcide Clever M.D.   On: 01/18/2016 22:02   Dg Abd Portable 1v  Result Date: 01/19/2016 CLINICAL DATA:  OG tube placement. EXAM: PORTABLE ABDOMEN - 1 VIEW COMPARISON:  None. FINDINGS: Tip and side port of the enteric tube below the diaphragm in the stomach. No bowel dilatation to suggest obstruction. Moderate stool burden. Excreted intravenous  contrast within the renal collecting systems from prior CT. IMPRESSION: Tip and side port of the enteric tube below the diaphragm in the stomach. Electronically Signed   By: Rubye Oaks M.D.   On: 01/19/2016 03:10     STUDIES:  CT head and neck 10/11 > no acute process. CT chest 10/11 > bilateral lower lobe opacities, right greater than left,.  CULTURES: Blood 10/11 > Sputum 10/11 >  ANTIBIOTICS: Zosyn 10/11 >  SIGNIFICANT EVENTS: 10/11 > admitted with overdose of cocaine. Required intubation due to GCS 3.  LINES/TUBES: ETT 10/11 >  DISCUSSION: 25 y.o. female with history of polysubstance abuse. Seen in [facility] ED 10/10 with heroin overdose, discharged after good response to Narcan. Seen again in [facility] ED 10/11 with overdose presumed due to cocaine. GCS remained 3, therefore required intubation for airway protection.  ASSESSMENT / PLAN:  NEUROLOGIC A:   Polysubstance abuse - seen in [facility] 10/10 for heroin overdose, now seen in [facility] ED for presumed cocaine overdose. UDS noted negative for opiates this admission. Acute encephalopathy - due to overdose from above. ? Seizure like activity. P:   Sedation:  Propofol gtt / Fentanyl PRN. RASS goal: 0 to -1. EEG today Polysubstance abuse counseling once extubated. Social work consult. Hold preadmission citalopram, gabapentin, trazodone.  PULMONARY A: Acute respiratory failure with inability to protect airway in setting of altered mental status due to overdose. Presumed aspiration pneumonia in LLL P:   Full vent support. Will start wake up and wean assessments VAP prevention measures.   CARDIOVASCULAR A:  Cocaine abuse. P:  Trend troponin. Avoid beta blockers.  RENAL A:   AKI. Pseudohypocalcemia - corrects to 8.6. P:   Continue NS @ 100.   GASTROINTESTINAL A:   GI prophylaxis. Nutrition. P:   SUP: Pantoprazole. NPO. Tube feeds if not able to extubate today.  HEMATOLOGIC A:   VTE Prophylaxis. P:   SCD's / heparin.  INFECTIOUS A:   Aspiration pneumonia. P:   Abx as above (Zosyn).  Follow cultures as above. Follow Pct  ENDOCRINE A:   Hyperglycemia - no Hx DM. P:   SSI. Assess Hgb A1c.  Family updated: None available.  Interdisciplinary Family Meeting v Palliative Care Meeting:  Due by: 01/26/16.  Critical care time- 35 mins.  Chilton Greathouse MD [facility] Pager [phone] If no answer or after 3pm call: [phone] 01/19/2016, 9:24 AM

## 2016-01-19 NOTE — Progress Notes (Signed)
CRITICAL VALUE ALERT  Critical value received:  Troponin: 0.88  Date of notification:  01/19/2016  Time of notification:  0332  Critical value read back:Yes.    Nurse who received alert:  Fransisco HertzJones, Kai Calico D, RN  MD notified (1st page):  Dr. Belia HemanKasa  Time of first page:  [phone]  MD notified (2nd page):  Time of second page:  Responding MD:  Dr. Belia HemanKasa  Time MD responded:  [phone] Continue to monitor.

## 2016-01-19 NOTE — ED Notes (Signed)
Pt increasingly restless and tachycardic on the cardiac monitor at this time, pt's hr noted to be 160-170 bpm.  MD notified of pt's increasing restlessness and tremulous behavior.

## 2016-01-19 NOTE — ED Notes (Signed)
Pt is noted to be moving her lower extremities at this time.  Dr. Hyacinth MeekerMiller notified of pt's need for sedation at this time.

## 2016-01-19 NOTE — Progress Notes (Signed)
Attempted to wean patient multiple times after sedation being cut off with minimal effort unless patient was stimulated and at that point would have agitation and reach for the tube. RN aware. Will attempt again later.

## 2016-01-19 NOTE — ED Notes (Signed)
Naloxone 2 mg IV given at this time.

## 2016-01-19 NOTE — ED Notes (Signed)
Ativan 2 mg PIV administered at this time per Dr. Hyacinth MeekerMiller for pt's tremulousness.

## 2016-01-19 NOTE — ED Notes (Signed)
Per Dr. Sabra Heck, pt needs to be intubated at this time.  RSI kit and ativan to the bedside at this time per Dr. Sabra Heck.

## 2016-01-19 NOTE — Progress Notes (Signed)
EEG completed; results pending.    

## 2016-01-19 NOTE — ED Notes (Signed)
Report called to Florentina AddisonKatie, RN at this time.  Receiving nurse denies having any further questions at this time.

## 2016-01-20 LAB — GLUCOSE, CAPILLARY
GLUCOSE-CAPILLARY: 73 mg/dL (ref 65–99)
GLUCOSE-CAPILLARY: 75 mg/dL (ref 65–99)
GLUCOSE-CAPILLARY: 71 mg/dL (ref 65–99)
GLUCOSE-CAPILLARY: 74 mg/dL (ref 65–99)
GLUCOSE-CAPILLARY: 86 mg/dL (ref 65–99)

## 2016-01-20 LAB — CALCIUM, IONIZED: Calcium, Ionized, Serum: 4.7 mg/dL (ref 4.5–5.6)

## 2016-01-20 LAB — MRSA PCR SCREENING: MRSA BY PCR: POSITIVE — AB

## 2016-01-20 LAB — HEMOGLOBIN A1C
HEMOGLOBIN A1C: 5 % (ref 4.8–5.6)
MEAN PLASMA GLUCOSE: 97 mg/dL

## 2016-01-20 MED ORDER — DEXMEDETOMIDINE HCL IN NACL 400 MCG/100ML IV SOLN
0.4000 ug/kg/h | INTRAVENOUS | Status: DC
  Administered 2016-01-20: 0.8 ug/kg/h via INTRAVENOUS
  Filled 2016-01-20 (×2): qty 100

## 2016-01-20 MED ORDER — SODIUM CHLORIDE 0.9 % IV BOLUS (SEPSIS)
500.0000 mL | Freq: Once | INTRAVENOUS | Status: AC
  Administered 2016-01-20: 500 mL via INTRAVENOUS

## 2016-01-20 MED ORDER — HALOPERIDOL LACTATE 5 MG/ML IJ SOLN
1.0000 mg | INTRAMUSCULAR | Status: DC | PRN
  Administered 2016-01-20 – 2016-01-21 (×3): 4 mg via INTRAVENOUS
  Filled 2016-01-20 (×3): qty 1

## 2016-01-20 MED ORDER — TRAZODONE HCL 100 MG PO TABS
100.0000 mg | ORAL_TABLET | Freq: Every evening | ORAL | Status: DC | PRN
  Administered 2016-01-20 – 2016-01-25 (×5): 100 mg via ORAL
  Filled 2016-01-20 (×5): qty 1

## 2016-01-20 NOTE — Progress Notes (Signed)
PULMONARY / CRITICAL CARE MEDICINE   Name: Brandy Sherman Royster MRN: [id number] DOB: [birth year]    ADMISSION DATE:  01/18/2016 CONSULTATION DATE:  01/18/16  REFERRING MD:  Hyacinth MeekerMiller - EDP  CHIEF COMPLAINT:  AMS  HISTORY OF PRESENT ILLNESS:  Pt is encephelopathic; therefore, this HPI is obtained from chart review. Brandy Sherman Humann is a 25 y.o. female with PMH as outlined below. [facility] on 10/10 with heroin overdose. She responded well to Narcan and was observed for several hours. She remains stable therefore was discharged home.  On 10/11 she was found unresponsive by a friend in her car. EMS was summoned and upon their arrival, patient had questionable seizure-like activity. She was given 2 mg of Versed and patient then had decerebrate posturing. She was given an additional 2 mg Versed with resolution. Prior to this she had also been given Narcan with no response.  When she [facility] ED, she had persistent AMS with GCS 3. She was subsequently intubated for airway protection. UDS was positive for benzos and cocaine. Patient was found to have multiple track marks throughout her body including left neck/EJ, bilateral upper extremities, bilateral lower extremities.  CTs of the head and neck were negative. CT of the chest only demonstrated right lower lobe opacity concerning for aspiration.  PAST MEDICAL HISTORY :  She  has a past medical history of Anxiety; Clotting disorder (HCC); Hepatitis C virus; HSV-2 (herpes simplex virus 2) infection; STD (sexually transmitted disease); and Substance abuse.  PAST SURGICAL HISTORY: She  has a past surgical history that includes Wrist surgery (Left) and arm surgery (Right).  No Known Allergies  No current facility-administered medications on file prior to encounter.    Current Outpatient Prescriptions on File Prior to Encounter  Medication Sig  . citalopram (CELEXA) 20 MG tablet Take 20 mg by mouth daily.  Marland Kitchen. gabapentin (NEURONTIN) 100 MG  capsule Take 100 mg by mouth 2 (two) times daily.  . naltrexone (DEPADE) 50 MG tablet Take 50 mg by mouth every morning.  . traZODone (DESYREL) 100 MG tablet Take 100 mg by mouth at bedtime.  . Levonorgestrel (KYLEENA) 19.5 MG IUD 1 each by Intrauterine route continuous.  . naproxen sodium (ANAPROX DS) 550 MG tablet Take 1 tablet (550 mg total) by mouth 2 (two) times daily with a meal.  . valACYclovir (VALTREX) 500 MG tablet Take 1 tablet po bid x 3 days prn (Patient taking differently: Take 500 mg by mouth 3 (three) times daily as needed (flare up.). Take 1 tablet po bid x 3 days prn)    FAMILY HISTORY:  Her indicated that her mother is alive. She indicated that her father is alive. She indicated that the status of her maternal grandmother is unknown. She indicated that the status of her maternal grandfather is unknown.    SOCIAL HISTORY: She  reports that she has never smoked. She has never used smokeless tobacco. She reports that she uses drugs, including Cocaine. She reports that she does not drink alcohol.  REVIEW OF SYSTEMS:  Unable to obtain as patient is encephalopathic.  SUBJECTIVE:   Intubated, No major events overnight. Given a fluid bolus for oliguria.  VITAL SIGNS: BP 138/89   Pulse 80   Temp 98.2 F (36.8 C) (Axillary)   Resp 15   Ht 5\' 6"  (1.676 m)   Wt 180 lb 8.9 oz (81.9 kg)   LMP 12/01/2015 Comment: i shielded patient  SpO2 100%   BMI 29.14 kg/m  HEMODYNAMICS:    VENTILATOR SETTINGS: Vent Mode: PSV;CPAP FiO2 (%):  [40 %] 40 % Set Rate:  [15 bmp] 15 bmp Vt Set:  [500 mL] 500 mL PEEP:  [5 cmH20] 5 cmH20 Pressure Support:  [5 cmH20] 5 cmH20 Plateau Pressure:  [15 cmH20-16 cmH20] 16 cmH20  INTAKE / OUTPUT: I/O last 3 completed shifts: In: 3494.1 [I.V.:3294.1; IV Piggyback:200] Out: 904 [Urine:904]   PHYSICAL EXAMINATION: General: Young Caucasian female, in no distress Neuro: Awake, no focal deficits HEENT: Pupils dilated,  responsive Cardiovascular: RRR, no MRG Lungs: Clear, no wheeze or crackles Abdomen: +BS, soft, Non tender, non distended Musculoskeletal: No gross deformities, no edema.  Skin: Multiple track marks throughout body including in the left neck, bilateral upper extremity's, bilateral lower extremities.  LABS:  BMET  Recent Labs Lab 01/17/16 0130 01/18/16 2001 01/19/16 0602  NA 137 136 138  K 3.5 3.9 3.2*  CL 102 104 107  CO2 23 21* 23  BUN 13 6 7   CREATININE 1.07* 1.27* 0.89  GLUCOSE 241* 241* 79    Electrolytes  Recent Labs Lab 01/17/16 0130 01/18/16 2001 01/19/16 0602  CALCIUM 8.6* 8.1* 7.7*  MG  --   --  1.7  PHOS  --   --  1.6*    CBC  Recent Labs Lab 01/17/16 0130 01/18/16 2001 01/19/16 0602  WBC 6.9 11.4* 8.7  HGB 11.9* 11.6* 10.1*  HCT 36.1 35.0* 30.7*  PLT 240 228 231    Coag's No results for input(s): APTT, INR in the last 168 hours.  Sepsis Markers  Recent Labs Lab 01/19/16 0200  LATICACIDVEN 1.1  PROCALCITON 1.95    ABG  Recent Labs Lab 01/18/16 2149 01/19/16 0514  PHART 7.352 7.467*  PCO2ART 42.4 34.8  PO2ART 129.0* 178*    Liver Enzymes  Recent Labs Lab 01/17/16 0130 01/18/16 2001  AST 52* 64*  ALT 72* 73*  ALKPHOS 37* 62  BILITOT 0.5 0.5  ALBUMIN 4.0 3.3*    Cardiac Enzymes  Recent Labs Lab 01/19/16 0200 01/19/16 0602 01/19/16 1235  TROPONINI 0.88* 0.85* 0.48*    Glucose  Recent Labs Lab 01/19/16 1204 01/19/16 1528 01/19/16 2005 01/19/16 2344 01/20/16 0327 01/20/16 0805  GLUCAP 90 82 84 78 73 75    Imaging No results found.   STUDIES:  CT head and neck 10/11 > no acute process. CT chest 10/11 > bilateral lower lobe opacities, right greater than left,.  CULTURES: Blood 10/11 > Sputum 10/11 >  ANTIBIOTICS: Zosyn 10/11 >  SIGNIFICANT EVENTS: 10/11 > admitted with overdose of cocaine. Required intubation due to GCS 3.  LINES/TUBES: ETT 10/11 >  DISCUSSION: 25 y.o. female with  history of polysubstance abuse. Seen in [facility] ED 10/10 with heroin overdose, discharged after good response to Narcan. Seen again in [facility] ED 10/11 with overdose presumed due to cocaine. GCS remained 3, therefore required intubation for airway protection.  ASSESSMENT / PLAN:  NEUROLOGIC A:   Polysubstance abuse - seen in [facility] 10/10 for heroin overdose, now seen in [facility] ED for presumed cocaine overdose. UDS noted negative for opiates this admission. Acute encephalopathy - due to overdose from above. ? Seizure like activity. P:   Continue precedex for agitation Polysubstance abuse counseling once extubated. Social work consult. Hold preadmission citalopram, gabapentin, trazodone.  PULMONARY A: Acute respiratory failure with inability to protect airway in setting of altered mental status due to overdose. Presumed aspiration pneumonia in L[facility] P:   Full vent support. Doing well on  weaning trials. Will extubate today.   CARDIOVASCULAR A:  Cocaine abuse. P:  Trend troponin. Avoid beta blockers.  RENAL A:   AKI. Pseudohypocalcemia - corrects to 8.6. P:   Continue NS @ 100.   GASTROINTESTINAL A:   GI prophylaxis. Nutrition. P:   SUP: Pantoprazole. NPO.  HEMATOLOGIC A:   VTE Prophylaxis. P:  SCD's / heparin.  INFECTIOUS A:   Aspiration pneumonia. P:   Abx as above (Zosyn).  Follow cultures as above. Pct algorithm to limit duration  ENDOCRINE A:   Hyperglycemia - no Hx DM. P:   SSI. Assess Hgb A1c.  Family updated: Mother updated 10/12.  Interdisciplinary Family Meeting v Palliative Care Meeting:  Due by: 01/26/16.  Critical care time- 35 mins.  Chilton Greathouse MD [facility] Pager [phone] If no answer or after 3pm call: [phone] 01/20/2016, 8:17 AM

## 2016-01-20 NOTE — Progress Notes (Signed)
eLink Physician-Brief Progress Note Patient Name: Tereasa CoopHannah Zwicker DOB: October 01, 1990 MRN: [id number]   Date of Service  01/20/2016  HPI/Events of Note  oliguria  eICU Interventions  Bolus 500 cc saline     Intervention Category Intermediate Interventions: Oliguria - evaluation and management  Max FickleDouglas McQuaid 01/20/2016, 12:19 AM

## 2016-01-20 NOTE — Progress Notes (Signed)
Noted decreased urine output, MD notified. 500 CC NS bolus ordered, will continue to monitor.  Georg Ruddlehristy Morey Andonian, RN

## 2016-01-20 NOTE — Procedures (Signed)
Extubation Procedure Note  Patient Details:   Name: Tereasa CoopHannah Sobecki DOB: 12/20/1990 MRN: [id number]   Airway Documentation:     Evaluation  O2 sats: stable throughout Complications: No apparent complications Patient did tolerate procedure well. Bilateral Breath Sounds: Clear   Yes  Patient tolerated wean. MD ordered to extubate. Positive for cuff leak. Patient extubated to a 2 Lpm nasal cannula. No signs of dyspnea or stridor. Patient resting comfortably. RN at bedside.   Ancil BoozerSmallwood, Evelisse Szalkowski 01/20/2016, 8:26 AM

## 2016-01-20 NOTE — Progress Notes (Signed)
Pt transfer from 64M alert and responsive but very confused with left and right peripheral IV, hyperactive, asking the same question over and over again but we reoriented her for what happened to her, she will go to the bathroom many times, with sitter at bedside, on close monitoring.

## 2016-01-21 DIAGNOSIS — T50904D Poisoning by unspecified drugs, medicaments and biological substances, undetermined, subsequent encounter: Secondary | ICD-10-CM

## 2016-01-21 LAB — CBC
HEMATOCRIT: 32.8 % — AB (ref 36.0–46.0)
HEMOGLOBIN: 11 g/dL — AB (ref 12.0–15.0)
MCH: 28.4 pg (ref 26.0–34.0)
MCHC: 33.5 g/dL (ref 30.0–36.0)
MCV: 84.5 fL (ref 78.0–100.0)
Platelets: 208 10*3/uL (ref 150–400)
RBC: 3.88 MIL/uL (ref 3.87–5.11)
RDW: 13.7 % (ref 11.5–15.5)
WBC: 5.2 10*3/uL (ref 4.0–10.5)

## 2016-01-21 LAB — URINE CULTURE

## 2016-01-21 LAB — BASIC METABOLIC PANEL
ANION GAP: 9 (ref 5–15)
BUN: <5 mg/dL — ABNORMAL LOW (ref 6–20)
CHLORIDE: 108 mmol/L (ref 101–111)
CO2: 24 mmol/L (ref 22–32)
CREATININE: 0.76 mg/dL (ref 0.44–1.00)
Calcium: 7.9 mg/dL — ABNORMAL LOW (ref 8.9–10.3)
GFR calc non Af Amer: >60 mL/min (ref >60)
Glucose, Bld: 75 mg/dL (ref 65–99)
POTASSIUM: 2.8 mmol/L — AB (ref 3.5–5.1)
SODIUM: 141 mmol/L (ref 135–145)

## 2016-01-21 LAB — MAGNESIUM: MAGNESIUM: 1.6 mg/dL — AB (ref 1.7–2.4)

## 2016-01-21 LAB — GLUCOSE, CAPILLARY: GLUCOSE-CAPILLARY: 199 mg/dL — AB (ref 65–99)

## 2016-01-21 LAB — VITAMIN B12: VITAMIN B 12: 565 pg/mL (ref 180–914)

## 2016-01-21 LAB — PHOSPHORUS: PHOSPHORUS: 2.5 mg/dL (ref 2.5–4.6)

## 2016-01-21 LAB — TSH: TSH: 2.131 u[IU]/mL (ref 0.350–4.500)

## 2016-01-21 MED ORDER — POTASSIUM CHLORIDE CRYS ER 20 MEQ PO TBCR
40.0000 meq | EXTENDED_RELEASE_TABLET | ORAL | Status: AC
Start: 2016-01-21 — End: 2016-01-21
  Administered 2016-01-21 (×2): 40 meq via ORAL
  Filled 2016-01-21 (×2): qty 2

## 2016-01-21 MED ORDER — ENSURE ENLIVE PO LIQD
237.0000 mL | Freq: Two times a day (BID) | ORAL | Status: DC
  Administered 2016-01-22 – 2016-01-24 (×4): 237 mL via ORAL

## 2016-01-21 MED ORDER — DIPHENHYDRAMINE HCL 50 MG/ML IJ SOLN
25.0000 mg | Freq: Four times a day (QID) | INTRAMUSCULAR | Status: DC | PRN
  Administered 2016-01-21: 25 mg via INTRAVENOUS
  Filled 2016-01-21 (×2): qty 1

## 2016-01-21 MED ORDER — POTASSIUM CHLORIDE 10 MEQ/100ML IV SOLN
10.0000 meq | INTRAVENOUS | Status: AC
  Administered 2016-01-21 – 2016-01-22 (×4): 10 meq via INTRAVENOUS
  Filled 2016-01-21 (×2): qty 100

## 2016-01-21 MED ORDER — MAGNESIUM SULFATE 2 GM/50ML IV SOLN
2.0000 g | Freq: Once | INTRAVENOUS | Status: AC
  Administered 2016-01-21: 2 g via INTRAVENOUS
  Filled 2016-01-21: qty 50

## 2016-01-21 MED ORDER — LORAZEPAM 2 MG/ML IJ SOLN
0.5000 mg | Freq: Two times a day (BID) | INTRAMUSCULAR | Status: DC | PRN
  Administered 2016-01-22: 0.5 mg via INTRAVENOUS
  Filled 2016-01-21: qty 1

## 2016-01-21 MED ORDER — GABAPENTIN 100 MG PO CAPS
100.0000 mg | ORAL_CAPSULE | Freq: Two times a day (BID) | ORAL | Status: DC
  Administered 2016-01-21: 100 mg via ORAL
  Filled 2016-01-21: qty 1

## 2016-01-21 NOTE — Progress Notes (Signed)
Pt scratching her head constantly but no rash noted anywhere.  No c/o pain at all.  BP slightly elevated but not bad. Does not seem like DTs to me but a reaction to the haldol possibly. Dr. Sunnie Nielsenegalado texted.  Pt/mom and dad instructed to drink plenty of fluids. Got an additional IV line by IV team.

## 2016-01-21 NOTE — Progress Notes (Addendum)
PROGRESS NOTE    Brandy Sherman  ZOX:[id number] DOB: [birth year] DOA: 01/18/2016 PCP: Hoyt Koch, MD   Brief Narrative:Pt is encephelopathic; therefore, this HPI is obtained from chart review. Brandy Sherman is a 25 y.o. female with PMH as outlined below. She presented to [facility] ED on 10/10 with heroin overdose. She responded well to Narcan and was observed for several hours. She remains stable therefore was discharged home.  On 10/11 she was found unresponsive by a friend in her car. EMS was summoned and upon their arrival, patient had questionable seizure-like activity. She was given 2 mg of Versed and patient then had decerebrate posturing. She was given an additional 2 mg Versed with resolution. Prior to this she had also been given Narcan with no response.  When she arrived [facility] ED, she had persistent AMS with GCS 3. She was subsequently intubated for airway protection. UDS was positive for benzos and cocaine. Patient was found to have multiple track marks throughout her body including left neck/EJ, bilateral upper extremities, bilateral lower extremities.  CTs of the head and neck were negative. CT of the chest only demonstrated right lower lobe opacity concerning for aspiration.    Assessment & Plan:   Active Problems:   Overdose   Seizure (HCC)  1-Overdose, cocaine, benzos. Second episode of overdose. Prior overdose with   heroin.  Will consult Psych. Will benefit from inpatient drug rehabilitation. SW consulted.  Continue with sitter.   Acute encephalopathy - due to overdose from above. ? Seizure like activity. Check B 12; normal, TSH normal.  PRN haldol.  Resume gabapentin, on tazadone PRN.  Per nurse patient with moving legs, scratching her head. Will dc haldol, IV benadryl PRN.  Will order low dose ativan in case of severe agitation.   Respiratory Failure secondary to overdose. Required intubation during this admission. with inability to protect airway in setting  of altered mental status due to overdose. S/P extubation 10-13. Respiratory status stable.   Aspiration PNA;  Continue with Zosyn. Day 2.   UTI, E coli.  On zosyn.   Elevated troponin; in setting of cocaine use. Trending down.   Hypomagnesemia; replete IV.  Hypokalemia; replete IV and orally. Repeat labs in am.  Mild transaminases; repeat in am. Check ammonia level.     DVT prophylaxis: heparin  Code Status: full code.  Family Communication: Mother at bedside,  Disposition Plan: remain inpatient. Psych consulted   Consultants:   CCM admitted patient.   Psych    Procedures:   Intubation on admission, Extubation 10-13.     Antimicrobials:  Zosyn 10-12   Subjective: She is alert, feeling ok, patient confuse,. Repeat herself over and over.    Objective: Vitals:   01/20/16 1800 01/20/16 1900 01/20/16 2009 01/21/16 0620  BP: 140/70 131/78 (!) 129/56 127/74  Pulse: 73 72  90  Resp: (!) 25 (!) 24 (!) 22 16  Temp:   99.7 F (37.6 C) 99.1 F (37.3 C)  TempSrc:   Oral Oral  SpO2: 99% 93% 96% 100%  Weight:    77.2 kg (170 lb 3.1 oz)  Height:    5\' 6"  (1.676 m)    Intake/Output Summary (Last 24 hours) at 01/21/16 1124 Last data filed at 01/21/16 1100  Gross per 24 hour  Intake             1775 ml  Output              225 ml  Net  1550 ml   Filed Weights   01/19/16 0200 01/20/16 0443 01/21/16 0620  Weight: 78.2 kg (172 lb 6.4 oz) 81.9 kg (180 lb 8.9 oz) 77.2 kg (170 lb 3.1 oz)    Examination:  General exam: Appears calm and comfortable  Respiratory system: Clear to auscultation. Respiratory effort normal. Cardiovascular system: S1 & S2 heard, RRR. No JVD, murmurs, rubs, gallops or clicks. No pedal edema. Gastrointestinal system: Abdomen is nondistended, soft and nontender. No organomegaly or masses felt. Normal bowel sounds heard. Central nervous system: Alert and oriented. No focal neurological deficits. Extremities: Symmetric 5 x 5  power. Skin: No rashes, lesions or ulcers Psychiatry: Judgement and insight appear normal. Mood & affect appropriate.     Data Reviewed: I have personally reviewed following labs and imaging studies  CBC:  Recent Labs Lab 01/17/16 0130 01/18/16 2001 01/19/16 0602 01/21/16 0802  WBC 6.9 11.4* 8.7 5.2  NEUTROABS  --  10.4*  --   --   HGB 11.9* 11.6* 10.1* 11.0*  HCT 36.1 35.0* 30.7* 32.8*  MCV 87.2 87.9 86.2 84.5  PLT 240 228 231 208   Basic Metabolic Panel:  Recent Labs Lab 01/17/16 0130 01/18/16 2001 01/19/16 0602 01/21/16 0802  NA 137 136 138 141  K 3.5 3.9 3.2* 2.8*  CL 102 104 107 108  CO2 23 21* 23 24  GLUCOSE 241* 241* 79 75  BUN 13 6 7  <5*  CREATININE 1.07* 1.27* 0.89 0.76  CALCIUM 8.6* 8.1* 7.7* 7.9*  MG  --   --  1.7 1.6*  PHOS  --   --  1.6* 2.5   GFR: Estimated Creatinine Clearance: 115.8 mL/min (by C-G formula based on SCr of 0.76 mg/dL). Liver Function Tests:  Recent Labs Lab 01/17/16 0130 01/18/16 2001  AST 52* 64*  ALT 72* 73*  ALKPHOS 37* 62  BILITOT 0.5 0.5  PROT 8.0 6.7  ALBUMIN 4.0 3.3*   No results for input(s): LIPASE, AMYLASE in the last 168 hours. No results for input(s): AMMONIA in the last 168 hours. Coagulation Profile: No results for input(s): INR, PROTIME in the last 168 hours. Cardiac Enzymes:  Recent Labs Lab 01/19/16 0200 01/19/16 0602 01/19/16 1235  TROPONINI 0.88* 0.85* 0.48*   BNP (last 3 results) No results for input(s): PROBNP in the last 8760 hours. HbA1C:  Recent Labs  01/19/16 0200  HGBA1C 5.0   CBG:  Recent Labs Lab 01/20/16 0327 01/20/16 0805 01/20/16 1217 01/20/16 1559 01/20/16 2030  GLUCAP 73 75 71 74 86   Lipid Profile:  Recent Labs  01/19/16 0200  TRIG 33   Thyroid Function Tests: No results for input(s): TSH, T4TOTAL, FREET4, T3FREE, THYROIDAB in the last 72 hours. Anemia Panel: No results for input(s): VITAMINB12, FOLATE, FERRITIN, TIBC, IRON, RETICCTPCT in the last 72  hours. Sepsis Labs:  Recent Labs Lab 01/19/16 0200  PROCALCITON 1.95  LATICACIDVEN 1.1    Recent Results (from the past 240 hour(s))  MRSA PCR Screening     Status: Abnormal   Collection Time: 01/19/16  1:51 AM  Result Value Ref Range Status   MRSA by PCR POSITIVE (A) NEGATIVE Final    Comment:        The GeneXpert MRSA Assay (FDA approved for NASAL specimens only), is one component of a comprehensive MRSA colonization surveillance program. It is not intended to diagnose MRSA infection nor to guide or monitor treatment for MRSA infections. RESULT CALLED TO, READ BACK BY AND VERIFIED WITH: J JONES,RN AT [phone]  01/19/16 BY M KELLY   Culture, blood (routine x 2)     Status: None (Preliminary result)   Collection Time: 01/19/16  2:32 AM  Result Value Ref Range Status   Specimen Description BLOOD LEFT THUMB  Final   Special Requests IN PEDIATRIC BOTTLE 3ML  Final   Culture NO GROWTH 2 DAYS  Final   Report Status PENDING  Incomplete  Culture, Urine     Status: Abnormal   Collection Time: 01/19/16  2:40 AM  Result Value Ref Range Status   Specimen Description URINE, CATHETERIZED  Final   Special Requests NONE  Final   Culture >=100,000 COLONIES/mL ESCHERICHIA COLI (A)  Final   Report Status 01/21/2016 FINAL  Final   Organism ID, Bacteria ESCHERICHIA COLI (A)  Final      Susceptibility   Escherichia coli - MIC*    AMPICILLIN >=32 RESISTANT Resistant     CEFAZOLIN <=4 SENSITIVE Sensitive     CEFTRIAXONE <=1 SENSITIVE Sensitive     CIPROFLOXACIN <=0.25 SENSITIVE Sensitive     GENTAMICIN >=16 RESISTANT Resistant     IMIPENEM <=0.25 SENSITIVE Sensitive     NITROFURANTOIN <=16 SENSITIVE Sensitive     TRIMETH/SULFA >=320 RESISTANT Resistant     AMPICILLIN/SULBACTAM 16 INTERMEDIATE Intermediate     PIP/TAZO <=4 SENSITIVE Sensitive     Extended ESBL NEGATIVE Sensitive     * >=100,000 COLONIES/mL ESCHERICHIA COLI  Culture, blood (routine x 2)     Status: None (Preliminary  result)   Collection Time: 01/19/16  2:42 AM  Result Value Ref Range Status   Specimen Description BLOOD RIGHT HAND  Final   Special Requests AEROBIC BOTTLE ONLY 6ML  Final   Culture NO GROWTH 2 DAYS  Final   Report Status PENDING  Incomplete         Radiology Studies: No results found.      Scheduled Meds: . Chlorhexidine Gluconate Cloth  6 each Topical Q0600  . heparin  5,000 Units Subcutaneous Q8H  . magnesium sulfate 1 - 4 g bolus IVPB  2 g Intravenous Once  . mupirocin ointment  1 application Nasal BID  . piperacillin-tazobactam (ZOSYN)  IV  3.375 g Intravenous Q8H   Continuous Infusions: . sodium chloride 100 mL/hr at 01/21/16 1041     LOS: 3 days    Time spent: 35 minutes.     Alba Coryegalado, Elnathan Fulford A, MD [facility] Pager [phone]  If 7PM-7AM, please contact night-coverage [email or web address] Password [email or web address] 01/21/2016, 11:24 AM

## 2016-01-21 NOTE — Progress Notes (Signed)
Talked to Dr. Sunnie Nielsenegalado when she called me back concerning the text.  Described pt condition and dystonia type reaction.  Orders received.

## 2016-01-21 NOTE — Significant Event (Signed)
Rapid Response Event Note  Overview:  Called by RN for possible reaction to Haldol Time Called: 1850 Arrival Time: 1855 Event Type: Other (Comment)  Initial Focused Assessment:  Upon my arrival to patients room, RN, Comptrolleritter and family at bedside.  Patient is lying in bed lethargic arouses to noxious stimuli.  As per RN patient had received some doses of haldol throughout the day and patient started to develop dystonia of the face and upper body, that seems to have progressed during the day.  Family states that at one point her face was distorted and she seemed to be "frozen" VSS and placed on continous po   Interventions:  MD was notified prior to my arrival.  25 mg IV benadryl was given prior to my arrival.  No RRT interventions at this time  Plan of Care (if not transferred):  Night time RRT to follow up on patient, [facility] and call if assistance needed  Event Summary:  Patient remained on floor with sitter at bedside   at      at          [facility], Maryagnes Amosenise Ann

## 2016-01-22 ENCOUNTER — Inpatient Hospital Stay (HOSPITAL_COMMUNITY): Payer: PRIVATE HEALTH INSURANCE

## 2016-01-22 DIAGNOSIS — E876 Hypokalemia: Secondary | ICD-10-CM

## 2016-01-22 DIAGNOSIS — Z833 Family history of diabetes mellitus: Secondary | ICD-10-CM

## 2016-01-22 DIAGNOSIS — Z8249 Family history of ischemic heart disease and other diseases of the circulatory system: Secondary | ICD-10-CM

## 2016-01-22 DIAGNOSIS — J690 Pneumonitis due to inhalation of food and vomit: Secondary | ICD-10-CM

## 2016-01-22 DIAGNOSIS — F332 Major depressive disorder, recurrent severe without psychotic features: Secondary | ICD-10-CM | POA: Diagnosis present

## 2016-01-22 DIAGNOSIS — Z8489 Family history of other specified conditions: Secondary | ICD-10-CM

## 2016-01-22 DIAGNOSIS — N390 Urinary tract infection, site not specified: Secondary | ICD-10-CM

## 2016-01-22 DIAGNOSIS — Z79899 Other long term (current) drug therapy: Secondary | ICD-10-CM

## 2016-01-22 DIAGNOSIS — G259 Extrapyramidal and movement disorder, unspecified: Secondary | ICD-10-CM

## 2016-01-22 LAB — COMPREHENSIVE METABOLIC PANEL
ALBUMIN: 2.8 g/dL — AB (ref 3.5–5.0)
ALT: 47 U/L (ref 14–54)
AST: 38 U/L (ref 15–41)
Alkaline Phosphatase: 32 U/L — ABNORMAL LOW (ref 38–126)
Anion gap: 8 (ref 5–15)
CHLORIDE: 111 mmol/L (ref 101–111)
CO2: 20 mmol/L — AB (ref 22–32)
Calcium: 8.3 mg/dL — ABNORMAL LOW (ref 8.9–10.3)
Creatinine, Ser: 0.71 mg/dL (ref 0.44–1.00)
GFR calc Af Amer: >60 mL/min (ref >60)
GFR calc non Af Amer: >60 mL/min (ref >60)
GLUCOSE: 88 mg/dL (ref 65–99)
POTASSIUM: 3.8 mmol/L (ref 3.5–5.1)
SODIUM: 139 mmol/L (ref 135–145)
Total Bilirubin: 0.4 mg/dL (ref 0.3–1.2)
Total Protein: 6.4 g/dL — ABNORMAL LOW (ref 6.5–8.1)

## 2016-01-22 LAB — HIV ANTIBODY (ROUTINE TESTING W REFLEX): HIV SCREEN 4TH GENERATION: NONREACTIVE

## 2016-01-22 LAB — CBC
HEMATOCRIT: 33.1 % — AB (ref 36.0–46.0)
Hemoglobin: 11.1 g/dL — ABNORMAL LOW (ref 12.0–15.0)
MCH: 28.3 pg (ref 26.0–34.0)
MCHC: 33.5 g/dL (ref 30.0–36.0)
MCV: 84.4 fL (ref 78.0–100.0)
PLATELETS: 212 10*3/uL (ref 150–400)
RBC: 3.92 MIL/uL (ref 3.87–5.11)
RDW: 14 % (ref 11.5–15.5)
WBC: 3.9 10*3/uL — AB (ref 4.0–10.5)

## 2016-01-22 LAB — AMMONIA: Ammonia: 29 umol/L (ref 9–35)

## 2016-01-22 LAB — MAGNESIUM: Magnesium: 1.8 mg/dL (ref 1.7–2.4)

## 2016-01-22 MED ORDER — LORAZEPAM 2 MG/ML IJ SOLN
1.0000 mg | Freq: Four times a day (QID) | INTRAMUSCULAR | Status: DC | PRN
  Administered 2016-01-23: 1 mg via INTRAVENOUS
  Filled 2016-01-22 (×2): qty 1

## 2016-01-22 MED ORDER — POTASSIUM CHLORIDE 10 MEQ/100ML IV SOLN
INTRAVENOUS | Status: AC
  Administered 2016-01-22: 10 meq
  Filled 2016-01-22: qty 100

## 2016-01-22 MED ORDER — LORAZEPAM 2 MG/ML IJ SOLN: 1.0000 mg | Freq: Four times a day (QID) | INTRAMUSCULAR | Status: DC | PRN

## 2016-01-22 MED ORDER — HEPARIN SODIUM (PORCINE) 5000 UNIT/ML IJ SOLN: 5000.0000 [IU] | Freq: Three times a day (TID) | INTRAMUSCULAR | Status: DC

## 2016-01-22 MED ORDER — MAGNESIUM SULFATE 2 GM/50ML IV SOLN
2.0000 g | Freq: Once | INTRAVENOUS | Status: AC
  Administered 2016-01-22: 2 g via INTRAVENOUS
  Filled 2016-01-22: qty 50

## 2016-01-22 MED ORDER — SODIUM CHLORIDE 0.9 % IV SOLN
1000.0000 mg | Freq: Once | INTRAVENOUS | Status: AC
  Administered 2016-01-22: 1000 mg via INTRAVENOUS
  Filled 2016-01-22: qty 10

## 2016-01-22 MED ORDER — LEVETIRACETAM 500 MG PO TABS
500.0000 mg | ORAL_TABLET | Freq: Two times a day (BID) | ORAL | Status: DC
  Administered 2016-01-22 – 2016-01-26 (×8): 500 mg via ORAL
  Filled 2016-01-22 (×9): qty 1

## 2016-01-22 MED ORDER — DIPHENHYDRAMINE HCL 50 MG/ML IJ SOLN: 25.0000 mg | Freq: Four times a day (QID) | INTRAMUSCULAR | Status: DC | PRN

## 2016-01-22 MED ORDER — HYDROXYZINE HCL 25 MG PO TABS
25.0000 mg | ORAL_TABLET | Freq: Three times a day (TID) | ORAL | Status: DC | PRN
  Administered 2016-01-22 – 2016-01-23 (×2): 25 mg via ORAL
  Filled 2016-01-22 (×2): qty 1

## 2016-01-22 NOTE — Progress Notes (Addendum)
PROGRESS NOTE    Brandy Sherman  ZOX:[id number] DOB: [birth year] DOA: 01/18/2016 PCP: Hoyt Koch, MD   Brief Narrative:Pt is encephelopathic; therefore, this HPI is obtained from chart review. Brandy Sherman is a 25 y.o. female with PMH as outlined below. She presented to [facility] ED on 10/10 with heroin overdose. She responded well to Narcan and was observed for several hours. She remains stable therefore was discharged home.  On 10/11 she was found unresponsive by a friend in her car. EMS was summoned and upon their arrival, patient had questionable seizure-like activity. She was given 2 mg of Versed and patient then had decerebrate posturing. She was given an additional 2 mg Versed with resolution. Prior to this she had also been given Narcan with no response.  When she arrived [facility] ED, she had persistent AMS with GCS 3. She was subsequently intubated for airway protection. UDS was positive for benzos and cocaine. Patient was found to have multiple track marks throughout her body including left neck/EJ, bilateral upper extremities, bilateral lower extremities.  CTs of the head and neck were negative. CT of the chest only demonstrated right lower lobe opacity concerning for aspiration.    Assessment & Plan:   Active Problems:   Overdose   Seizure (HCC)  1-Overdose, Cocaine, Benzos. Second episode of overdose. Prior overdose with heroin.  Will consult Psych. Will benefit from inpatient drug rehabilitation. SW consulted.  Continue with sitter.   2-Acute encephalopathy - due to overdose from above. ? Seizure like activity on admission .  B 12; normal, TSH normal.  Patient appears to have develops dystonia from haldol. She is doing better this morning after benadryl.  on tazadone PRN.  low dose ativan in case of severe agitation.  Will check MRI brain.  Ammonia normal.  EEG on admission negative for seizure.  MRI concerning with seizure ?, RN report an episode where the  patient was drooling , no responsive, extending legs. Patient received ativan at that time. Neurology consulted.   3-Respiratory Failure secondary to overdose. Required intubation during this admission. with inability to protect airway in setting of altered mental status due to overdose. S/P extubation 10-13. Respiratory status stable.   Aspiration PNA;  Continue with Zosyn. Day 3.   UTI, E coli.  On zosyn.   Elevated troponin; in setting of cocaine use. Trending down. Check ECHO./   Hypomagnesemia; replete IV.  Hypokalemia; resolved.   Mild transaminases;  Normalized.  Screening for HIV non reactive.   DVT prophylaxis: heparin  Code Status: full code.  Family Communication: Mother at bedside,  Disposition Plan: remain inpatient. Psych consulted   Consultants:   CCM admitted patient.   Psych    Procedures:   Intubation on admission, Extubation 10-13.     Antimicrobials:  Zosyn 10-12   Subjective: She is alert this morning. Oriented to place and person. Knows  she is in the hospital Denies cough, chest pain.   Objective: Vitals:   01/21/16 1646 01/21/16 1859 01/21/16 2113 01/22/16 0515  BP: (!) 144/83 139/70 133/69 (!) 113/58  Pulse: 93 90 80 (!) 58  Resp: 17  17 17   Temp: 99.3 F (37.4 C) 98.3 F (36.8 C) 98.8 F (37.1 C) 98.5 F (36.9 C)  TempSrc: Oral Oral Oral Oral  SpO2: 99% 98% 99% 98%  Weight:      Height:        Intake/Output Summary (Last 24 hours) at 01/22/16 0725 Last data filed at 01/21/16 1716  Gross per  24 hour  Intake          1796.67 ml  Output              200 ml  Net          1596.67 ml   Filed Weights   01/19/16 0200 01/20/16 0443 01/21/16 0620  Weight: 78.2 kg (172 lb 6.4 oz) 81.9 kg (180 lb 8.9 oz) 77.2 kg (170 lb 3.1 oz)    Examination:  General exam: Appears calm and comfortable  Respiratory system: Clear to auscultation. Respiratory effort normal. Cardiovascular system: S1 & S2 heard, RRR. No JVD, murmurs, rubs,  gallops or clicks. No pedal edema. Gastrointestinal system: Abdomen is nondistended, soft and nontender. No organomegaly or masses felt. Normal bowel sounds heard. Central nervous system: Alert and oriented. No focal neurological deficits. Extremities: Symmetric 5 x 5 power. Skin: No rashes, lesions or ulcers Psychiatry: Judgement and insight appear normal. Mood & affect appropriate.     Data Reviewed: I have personally reviewed following labs and imaging studies  CBC:  Recent Labs Lab 01/17/16 0130 01/18/16 2001 01/19/16 0602 01/21/16 0802 01/22/16 0523  WBC 6.9 11.4* 8.7 5.2 3.9*  NEUTROABS  --  10.4*  --   --   --   HGB 11.9* 11.6* 10.1* 11.0* 11.1*  HCT 36.1 35.0* 30.7* 32.8* 33.1*  MCV 87.2 87.9 86.2 84.5 84.4  PLT 240 228 231 208 212   Basic Metabolic Panel:  Recent Labs Lab 01/17/16 0130 01/18/16 2001 01/19/16 0602 01/21/16 0802 01/22/16 0523  NA 137 136 138 141 139  K 3.5 3.9 3.2* 2.8* 3.8  CL 102 104 107 108 111  CO2 23 21* 23 24 20*  GLUCOSE 241* 241* 79 75 88  BUN 13 6 7  <5* <5*  CREATININE 1.07* 1.27* 0.89 0.76 0.71  CALCIUM 8.6* 8.1* 7.7* 7.9* 8.3*  MG  --   --  1.7 1.6* 1.8  PHOS  --   --  1.6* 2.5  --    GFR: Estimated Creatinine Clearance: 115.8 mL/min (by C-G formula based on SCr of 0.71 mg/dL). Liver Function Tests:  Recent Labs Lab 01/17/16 0130 01/18/16 2001 01/22/16 0523  AST 52* 64* 38  ALT 72* 73* 47  ALKPHOS 37* 62 32*  BILITOT 0.5 0.5 0.4  PROT 8.0 6.7 6.4*  ALBUMIN 4.0 3.3* 2.8*   No results for input(s): LIPASE, AMYLASE in the last 168 hours.  Recent Labs Lab 01/22/16 0523  AMMONIA 29   Coagulation Profile: No results for input(s): INR, PROTIME in the last 168 hours. Cardiac Enzymes:  Recent Labs Lab 01/19/16 0200 01/19/16 0602 01/19/16 1235  TROPONINI 0.88* 0.85* 0.48*   BNP (last 3 results) No results for input(s): PROBNP in the last 8760 hours. HbA1C: No results for input(s): HGBA1C in the last 72  hours. CBG:  Recent Labs Lab 01/20/16 0805 01/20/16 1217 01/20/16 1559 01/20/16 2030 01/21/16 1901  GLUCAP 75 71 74 86 199*   Lipid Profile: No results for input(s): CHOL, HDL, LDLCALC, TRIG, CHOLHDL, LDLDIRECT in the last 72 hours. Thyroid Function Tests:  Recent Labs  01/21/16 1220  TSH 2.131   Anemia Panel:  Recent Labs  01/21/16 1220  VITAMINB12 565   Sepsis Labs:  Recent Labs Lab 01/19/16 0200  PROCALCITON 1.95  LATICACIDVEN 1.1    Recent Results (from the past 240 hour(s))  MRSA PCR Screening     Status: Abnormal   Collection Time: 01/19/16  1:51 AM  Result Value Ref  Range Status   MRSA by PCR POSITIVE (A) NEGATIVE Final    Comment:        The GeneXpert MRSA Assay (FDA approved for NASAL specimens only), is one component of a comprehensive MRSA colonization surveillance program. It is not intended to diagnose MRSA infection nor to guide or monitor treatment for MRSA infections. RESULT CALLED TO, READ BACK BY AND VERIFIED WITH: Sheliah Plane AT 5409 01/19/16 BY M KELLY   Culture, blood (routine x 2)     Status: None (Preliminary result)   Collection Time: 01/19/16  2:32 AM  Result Value Ref Range Status   Specimen Description BLOOD LEFT THUMB  Final   Special Requests IN PEDIATRIC BOTTLE  Final   Culture NO GROWTH 2 DAYS  Final   Report Status PENDING  Incomplete  Culture, Urine     Status: Abnormal   Collection Time: 01/19/16  2:40 AM  Result Value Ref Range Status   Specimen Description URINE, CATHETERIZED  Final   Special Requests NONE  Final   Culture >=100,000 COLONIES/mL ESCHERICHIA COLI (A)  Final   Report Status 01/21/2016 FINAL  Final   Organism ID, Bacteria ESCHERICHIA COLI (A)  Final      Susceptibility   Escherichia coli - MIC*    AMPICILLIN >=32 RESISTANT Resistant     CEFAZOLIN <=4 SENSITIVE Sensitive     CEFTRIAXONE <=1 SENSITIVE Sensitive     CIPROFLOXACIN <=0.25 SENSITIVE Sensitive     GENTAMICIN >=16 RESISTANT  Resistant     IMIPENEM <=0.25 SENSITIVE Sensitive     NITROFURANTOIN <=16 SENSITIVE Sensitive     TRIMETH/SULFA >=320 RESISTANT Resistant     AMPICILLIN/SULBACTAM 16 INTERMEDIATE Intermediate     PIP/TAZO <=4 SENSITIVE Sensitive     Extended ESBL NEGATIVE Sensitive     * >=100,000 COLONIES/mL ESCHERICHIA COLI  Culture, blood (routine x 2)     Status: None (Preliminary result)   Collection Time: 01/19/16  2:42 AM  Result Value Ref Range Status   Specimen Description BLOOD RIGHT HAND  Final   Special Requests AEROBIC BOTTLE ONLY  Final   Culture NO GROWTH 2 DAYS  Final   Report Status PENDING  Incomplete         Radiology Studies: No results found.      Scheduled Meds: . Chlorhexidine Gluconate Cloth  6 each Topical Q0600  . feeding supplement (ENSURE ENLIVE)  237 mL Oral BID BM  . heparin  5,000 Units Subcutaneous Q8H  . mupirocin ointment  1 application Nasal BID  . piperacillin-tazobactam (ZOSYN)  IV  3.375 g Intravenous Q8H   Continuous Infusions: . sodium chloride 100 mL/hr at 01/21/16 2219     LOS: 4 days    Time spent: 35 minutes.     Alba Cory, MD [facility] Pager [phone]  If 7PM-7AM, please contact night-coverage [email or web address] Password [email or web address] 01/22/2016, 7:25 AM

## 2016-01-22 NOTE — Consult Note (Signed)
[facility] Consult   Reason for Consult:  Suicidal ideation Referring Physician:  [place] Patient Identification: Brandy Sherman MRN:  [id number] Principal Diagnosis: MDD (major depressive disorder), recurrent severe, without psychosis ([place]) Diagnosis:   Patient Active Problem List   Diagnosis Date Noted  . MDD (major depressive disorder), recurrent severe, without psychosis ([place]) [F33.2] 01/22/2016    Priority: High  . Overdose [T50.901A] 01/19/2016    Priority: High  . Cocaine abuse [F14.10]     Priority: Medium  . Aspiration pneumonia ([place]) [J69.0] 01/22/2016  . Hypokalemia [E87.6] 01/22/2016  . Extrapyramidal syndrome [G25.9] 01/22/2016  . UTI (urinary tract infection) [N39.0] 01/22/2016  . Acute hypoxemic respiratory failure ([place]) [J96.01]   . AKI (acute kidney injury) ([place]) [N17.9]   . Seizure ([place]) [R56.9]   . Dysmenorrhea [N94.6] 12/07/2015  . 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 Hepatitis, viral [B19.9] 12/07/2015  . History of substance abuse [Z87.898] 12/07/2015    Total Time spent with patient: 40 minutes   Subjective:   Brandy Sherman is a 25 y.o. female patient admitted with reports of second overdose within a few days requiring hospital admission with encephalopathy. Pt seen and chart reviewed. Pt is alert/oriented x4, calm, cooperative, and appropriate to situation. Pt denies suicidal/homicidal ideation and psychosis and does not appear to be responding to internal stimuli. However, pt has had 2 overdoses in a short time-frame and she is considered high risk for suicide-completion. Although she denies suicidal ideation/intent at this time, we cannot determine the true intent at the time of her overdose as she states she cannot recall those events. See recommendations below.  HPI:  Brandy Sherman is a 25 y.o. female admitted to the hospital with PMH as outlined below. She presented to [facility] ED on 10/10 with heroin overdose. She responded well to Narcan  and was observed for several hours. She remains stable therefore was discharged home.  On 10/11 she was found unresponsive by a friend in her car. EMS was summoned and upon their arrival, patient had questionable seizure-like activity. She was given 2 mg of Versed and patient then had decerebrate posturing. She was given an additional 2 mg Versed with resolution. Prior to this she had also been given Narcan with no response.  When she arrived [facility] ED, she had persistent AMS with GCS 3. She was subsequently intubated for airway protection. UDS was positive for benzos and cocaine. Patient was found to have multiple track marks throughout her body including left neck/EJ, bilateral upper extremities, bilateral lower extremities.  CTs of the head and neck were negative. CT of the chest only demonstrated right lower lobe opacity concerning for aspiration.  Pt seen today on 01/22/16 as above. Pt has been cooperative with staff on the medical floor although experiencing mild memory lapses, although pt did have encephalopathy upon arrival. Pt evaluated as above.   Past Psychiatric History: MDD, opiate dependence   Risk to Self: Is patient at risk for suicide?: Yes Risk to Others:   Prior Inpatient Therapy:   Prior Outpatient Therapy:    Past Medical History:  Past Medical History:  Diagnosis Date  . Anxiety   . Clotting disorder ([place])   . Hepatitis C virus   . HSV-2 (herpes simplex virus 2) infection   . STD (sexually transmitted disease)    HSV II  . 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  arm surgery Right   . WRIST SURGERY Left    abcess    Family History:  Family History  Problem Relation Age of Onset  . Melanoma Mother   . Hypertension Father   . Hyperlipidemia Father   . Diabetes Maternal Grandmother   . Diabetes Maternal Grandfather    Family Psychiatric  History: MDD Social  History:  History  Alcohol Use No     History  Drug Use  . Types: Cocaine    Comment: was clean for over 200 days prior to [facility] 2017; heroin, crack, cocaine use    Social History   Social History  . Marital status: Single    Spouse name: N/A  . Number of children: N/A  . Years of education: N/A   Social History Main Topics  . Smoking status: Never Smoker  . Smokeless tobacco: Never Used  . Alcohol use No  . Drug use:     Types: Cocaine     Comment: was clean for over 200 days prior to [facility] 2017; heroin, crack, cocaine use  . Sexual activity: Yes    Partners: Male    Birth control/ protection: IUD   Other Topics Concern  . None   Social History Narrative  . None   Additional Social History:    Allergies:   Allergies  Allergen Reactions  . Haldol [Haloperidol Lactate] Other (See Comments)    Patient develops dystonia, extrapyramidal syndrome.     Labs:  Results for orders placed or performed during the hospital encounter of 01/18/16 (from the past 48 hour(s))  Glucose, capillary     Status: None   Collection Time: 01/20/16 12:17 PM  Result Value Ref Range   Glucose-Capillary 71 65 - 99 mg/dL  Glucose, capillary     Status: None   Collection Time: 01/20/16  3:59 PM  Result Value Ref Range   Glucose-Capillary 74 65 - 99 mg/dL  Glucose, capillary     Status: None   Collection Time: 01/20/16  8:30 PM  Result Value Ref Range   Glucose-Capillary 86 65 - 99 mg/dL  CBC     Status: Abnormal   Collection Time: 01/21/16  8:02 AM  Result Value Ref Range   WBC 5.2 4.0 - 10.5 K/uL   RBC 3.88 3.87 - 5.11 MIL/uL   Hemoglobin 11.0 (L) 12.0 - 15.0 g/dL   HCT 32.8 (L) 36.0 - 46.0 %   MCV 84.5 78.0 - 100.0 fL   MCH 28.4 26.0 - 34.0 pg   MCHC 33.5 30.0 - 36.0 g/dL   RDW 13.7 11.5 - 15.5 %   Platelets 208 150 - 400 K/uL  Basic metabolic panel     Status: Abnormal   Collection Time: 01/21/16  8:02 AM  Result Value Ref Range   Sodium 141 135 - 145 mmol/L    Potassium 2.8 (L) 3.5 - 5.1 mmol/L   Chloride 108 101 - 111 mmol/L   CO2 24 22 - 32 mmol/L   Glucose, Bld 75 65 - 99 mg/dL   BUN <5 (L) 6 - 20 mg/dL   Creatinine, Ser 0.76 0.44 - 1.00 mg/dL   Calcium 7.9 (L) 8.9 - 10.3 mg/dL   GFR calc non Af Amer >60 >60 mL/min   GFR calc Af Amer >60 >60 mL/min    Comment: (NOTE) The eGFR has been calculated using the CKD EPI equation. This calculation has not been validated in all clinical situations. eGFR's persistently <60 mL/min signify possible Chronic Kidney  Disease.    Anion gap 9 5 - 15  Magnesium     Status: Abnormal   Collection Time: 01/21/16  8:02 AM  Result Value Ref Range   Magnesium 1.6 (L) 1.7 - 2.4 mg/dL  Phosphorus     Status: None   Collection Time: 01/21/16  8:02 AM  Result Value Ref Range   Phosphorus 2.5 2.5 - 4.6 mg/dL  HIV antibody     Status: None   Collection Time: 01/21/16 12:20 PM  Result Value Ref Range   HIV Screen 4th Generation wRfx Non Reactive Non Reactive    Comment: (NOTE) Performed At: [facility] [place], [place] [id number] Lindon Romp MD EM:[id number]   TSH     Status: None   Collection Time: 01/21/16 12:20 PM  Result Value Ref Range   TSH 2.131 0.350 - 4.500 uIU/mL    Comment: Performed by a 3rd Generation assay with a functional sensitivity of <=0.01 uIU/mL.  Vitamin B12     Status: None   Collection Time: 01/21/16 12:20 PM  Result Value Ref Range   Vitamin B-12 565 180 - 914 pg/mL    Comment: (NOTE) This assay is not validated for testing neonatal or myeloproliferative syndrome specimens for Vitamin B12 levels.   Glucose, capillary     Status: Abnormal   Collection Time: 01/21/16  7:01 PM  Result Value Ref Range   Glucose-Capillary 199 (H) 65 - 99 mg/dL  CBC     Status: Abnormal   Collection Time: 01/22/16  5:23 AM  Result Value Ref Range   WBC 3.9 (L) 4.0 - 10.5 K/uL   RBC 3.92 3.87 - 5.11 MIL/uL   Hemoglobin 11.1 (L) 12.0 - 15.0 g/dL   HCT 33.1 (L) 36.0 -  46.0 %   MCV 84.4 78.0 - 100.0 fL   MCH 28.3 26.0 - 34.0 pg   MCHC 33.5 30.0 - 36.0 g/dL   RDW 14.0 11.5 - 15.5 %   Platelets 212 150 - 400 K/uL  Comprehensive metabolic panel     Status: Abnormal   Collection Time: 01/22/16  5:23 AM  Result Value Ref Range   Sodium 139 135 - 145 mmol/L   Potassium 3.8 3.5 - 5.1 mmol/L    Comment: DELTA CHECK NOTED   Chloride 111 101 - 111 mmol/L   CO2 20 (L) 22 - 32 mmol/L   Glucose, Bld 88 65 - 99 mg/dL   BUN <5 (L) 6 - 20 mg/dL   Creatinine, Ser 0.71 0.44 - 1.00 mg/dL   Calcium 8.3 (L) 8.9 - 10.3 mg/dL   Total Protein 6.4 (L) 6.5 - 8.1 g/dL   Albumin 2.8 (L) 3.5 - 5.0 g/dL   AST 38 15 - 41 U/L   ALT 47 14 - 54 U/L   Alkaline Phosphatase 32 (L) 38 - 126 U/L   Total Bilirubin 0.4 0.3 - 1.2 mg/dL   GFR calc non Af Amer >60 >60 mL/min   GFR calc Af Amer >60 >60 mL/min    Comment: (NOTE) The eGFR has been calculated using the CKD EPI equation. This calculation has not been validated in all clinical situations. eGFR's persistently <60 mL/min signify possible Chronic Kidney Disease.    Anion gap 8 5 - 15  Ammonia     Status: None   Collection Time: 01/22/16  5:23 AM  Result Value Ref Range   Ammonia 29 9 - 35 umol/L  Magnesium     Status: None  Collection Time: 01/22/16  5:23 AM  Result Value Ref Range   Magnesium 1.8 1.7 - 2.4 mg/dL    Current Facility-Administered Medications  Medication Dose Route Frequency Provider Last Rate Last Dose  . 0.9 %  sodium chloride infusion   Intravenous Continuous Rahul P Desai, PA-C 100 mL/hr at 01/22/16 1139    . 0.9 %  sodium chloride infusion  250 mL Intravenous PRN Rahul P Desai, PA-C      . Chlorhexidine Gluconate Cloth 2 % PADS 6 each  6 each Topical Q0600 Flora Lipps, MD   6 each at 01/21/16 0610  . diphenhydrAMINE (BENADRYL) injection 25 mg  25 mg Intravenous Q6H PRN Belkys A Regalado, MD   25 mg at 01/21/16 1852  . feeding supplement (ENSURE ENLIVE) (ENSURE ENLIVE) liquid 237 mL  237 mL Oral  BID BM Belkys A Regalado, MD   237 mL at 01/22/16 1019  . heparin injection 5,000 Units  5,000 Units Subcutaneous Q8H Rahul P Desai, PA-C   5,000 Units at 01/22/16 2440  . LORazepam (ATIVAN) injection 0.5 mg  0.5 mg Intravenous Q12H PRN Belkys A Regalado, MD      . mupirocin ointment (BACTROBAN) 2 % 1 application  1 application Nasal BID Flora Lipps, MD   1 application at 02/03/24 1019  . piperacillin-tazobactam (ZOSYN) IVPB 3.375 g  3.375 g Intravenous Q8H Erenest Blank, RPH   3.375 g at 01/22/16 3664  . traZODone (DESYREL) tablet 100 mg  100 mg Oral QHS PRN Mauri [place], MD   100 mg at 01/20/16 2356    Musculoskeletal: Strength & Muscle Tone: within normal limits Gait & Station: normal Patient leans: N/A  Psychiatric Specialty Exam: Physical Exam  Review of Systems  Psychiatric/Behavioral: Positive for depression, substance abuse and suicidal ideas (pt denies but high risk). Negative for hallucinations. The patient is nervous/anxious. The patient does not have insomnia.   All other systems reviewed and are negative.   Blood pressure (!) 113/58, pulse (!) 58, temperature 98.5 F (36.9 C), temperature source Oral, resp. rate 17, height 5' 6"  (1.676 m), weight 75.9 kg (167 lb 5.3 oz), last menstrual period 12/01/2015, SpO2 98 %.Body mass index is 27.01 kg/m.  General Appearance: Casual and Fairly Groomed  Eye Contact:  Good  Speech:  Clear and Coherent and Normal Rate  Volume:  Normal  Mood:  Anxious and Depressed  Affect:  Appropriate, Congruent and Depressed  Thought Process:  Coherent, Goal Directed, Linear and Descriptions of Associations: Intact  Orientation:  Full (Time, Place, and Person)  Thought Content:  Symptoms, worries, concerns  Suicidal Thoughts:  No  Homicidal Thoughts:  No  Memory:  Immediate;   Fair Recent;   Fair Remote;   Fair  Judgement:  Fair  Insight:  Fair  Psychomotor Activity:  Normal  Concentration:  Concentration: Fair and Attention Span: Fair   Recall:  [employer] of Knowledge:  Fair  Language:  Fair  Akathisia:  No  Handed:    AIMS (if indicated):     Assets:  Communication Skills Resilience Social Support Talents/Skills  ADL's:  Intact  Cognition:  WNL  Sleep:      Treatment Plan Summary: MDD (major depressive disorder), recurrent severe, without psychosis ([place])  Unstable and would benefit from inpatient psychiatric admission for safety and stabilization. Pt also wants long-term rehab such as [facility] for rehab as she has been there in the past with good results.   Medications -Lexapro 29m po daily  for MDD/anxiety -Buspar 7.53m po bid for severe anxiety -Vistaril 263mpo q6h prn breakthrough anxiety  Disposition: Recommend psychiatric Inpatient admission when medically cleared.  WiBenjamine MolaFNNorth Carolina0/15/2017 12:13 PM   Agree with NP note and assessment

## 2016-01-22 NOTE — Consult Note (Addendum)
Neurology Consultation Reason for Consult: Abnormal M Referring Physician: Regalado, B  CC: Abnormal movements  History is obtained from: Family  HPI: Brandy Sherman is a 25 y.o. female with a history of drug abuse who presents with unresponsiveness that was presumed to be due to drug overdose. She knows that she had a drug overdose last Monday, and was in the ED for some time and responded well to Narcan. She told her family that she used heroin mixed with cocaine, possibly laced with fentanyl. They talked to her about this on Tuesday and she had memory of the event and seemed in her normal state, but this was over the phone.  On Wednesday, she was admitted here with again presumed drug overdose. She was found unresponsive in a car. She had possible seizure activity en route, and was given Versed by EMS.  [facility] she had a GCS of 3 UDS was positive for benzos and cocaine. She was intubated and an EEG was performed on 10/12 which just showed changes typical of propofol sedation.  She has greatly improved since admission, and is no longer encephalopathic with the exception of the fact that she has terrible "short-term memory."  She has had multiple episodes concerning for partial seizures over the past few days. Her mother describes that she has stiffening on the left side, with shaking of lower extremity last for a minute or 2. She does not have recollection of these episodes.     ROS: A 14 point ROS was performed and is negative except as noted in the HPI.   Past Medical History:  Diagnosis Date  . Anxiety   . Clotting disorder ([facility])   . Hepatitis C virus   . HSV-2 (herpes simplex virus 2) infection   . STD (sexually transmitted disease)    HSV II  . 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 Melanoma Mother   . Hypertension Father   . Hyperlipidemia Father   . Diabetes  Maternal Grandmother   . Diabetes Maternal Grandfather      Social History:  reports that she has never smoked. She has never used smokeless tobacco. She reports that she uses drugs, including Cocaine. She reports that she does not drink alcohol.   Exam: Current vital signs: BP (!) 155/92 (BP Location: Right Arm)   Pulse 95   Temp 98.5 F (36.9 C) (Oral)   Resp 17   Ht 5\' 6"  (1.676 m)   Wt 75.9 kg (167 lb 5.3 oz)   LMP 12/01/2015 Comment: i shielded patient  SpO2 100%   BMI 27.01 kg/m  Vital signs in last 24 hours: Temp:  [98.3 F (36.8 C)-99.3 F (37.4 C)] 98.5 F (36.9 C) (10/15 0515) Pulse Rate:  [58-95] 95 (10/15 1240) Resp:  [17] 17 (10/15 0515) BP: (113-155)/(58-92) 155/92 (10/15 1240) SpO2:  [98 %-100 %] 100 % (10/15 1240) Weight:  [75.9 kg (167 lb 5.3 oz)] 75.9 kg (167 lb 5.3 oz) (10/15 0700)   Physical Exam  Constitutional: Appears well-developed and well-nourished.  Psych: Affect appropriate to situation Eyes: No scleral injection HENT: No OP obstrucion Head: Normocephalic.  Cardiovascular: Normal rate and regular rhythm.  Respiratory: Effort normal and breath sounds normal to anterior ascultation GI: Soft.  No distension. There is no tenderness.  Skin: WDI  Neuro: Mental Status: Patient is awake, alert, oriented to  person, place, month, year, and situation. No signs of aphasia or neglect She has difficulty with spelling world backwards, but otherwise appears completely clear. Cranial Nerves: II: Visual Fields are full. Pupils are equal, round, and reactive to light.   III,IV, VI: EOMI without ptosis or diploplia.  V: Facial sensation is symmetric to temperature VII: Facial movement is symmetric.  VIII: hearing is intact to voice X: Uvula elevates symmetrically XI: Shoulder shrug is symmetric. XII: tongue is midline without atrophy or fasciculations.  Motor: Tone is normal. Bulk is normal. 5/5 strength was present in all four extremities.   Sensory: Sensation is symmetric to light touch and temperature in the arms and legs. Cerebellar: FNF intact bilaterally  I have reviewed labs in epic and the results pertinent to this consultation are: UDS positive for cocaine. Positive for opiates the previous admission  I have reviewed the images obtained: MRI-bilateral restricted diffusion in the hippocampi.  Impression: 25 year old female with seizures after presumed drug overdose. My suspicion is that she had a hypoxic injury due to overdose. Also possible would be that she went into status epilepticus due to cocaine use.  Less commonly, limbic encephalitis can present with restricted diffusion in the bilateral hippocampi. Given her otherwise relatively clear sensorium I think that this is much less likely. That being said, I do think an LP looking for signs of inflammation including IgG index would be prudent. She unfortunately just received a dose of heparin just prior to my arrival. I do not think this is urgent at this time, and would not justify the risks associated with doing it just after receiving the blood thinner. This could be done tomorrow.  I strongly suspect that the episodes that were felt to be possible dystonic reaction to Haldol are instead partial seizures.  Recommendations: 1) Keppra 1 g IV 1 followed by 500 mg twice a day 2) EEG again tomorrow 3) lumbar puncture for cells, glucose, protein, IgG index, oligoclonal bands. 4) if she has any subsequent worsening, or evidence of inflammation on LP then would consider further broadening differential to include autoimmune causes.   Ritta Slot, MD [facility] [phone]  If 7pm- 7am, please page neurology on call as listed in AMION.

## 2016-01-23 ENCOUNTER — Inpatient Hospital Stay (HOSPITAL_COMMUNITY): Payer: PRIVATE HEALTH INSURANCE

## 2016-01-23 ENCOUNTER — Other Ambulatory Visit (HOSPITAL_COMMUNITY): Payer: PRIVATE HEALTH INSURANCE

## 2016-01-23 DIAGNOSIS — F332 Major depressive disorder, recurrent severe without psychotic features: Secondary | ICD-10-CM

## 2016-01-23 LAB — BASIC METABOLIC PANEL
ANION GAP: 7 (ref 5–15)
BUN: <5 mg/dL — ABNORMAL LOW (ref 6–20)
CHLORIDE: 111 mmol/L (ref 101–111)
CO2: 21 mmol/L — ABNORMAL LOW (ref 22–32)
Calcium: 8.7 mg/dL — ABNORMAL LOW (ref 8.9–10.3)
Creatinine, Ser: 0.7 mg/dL (ref 0.44–1.00)
GFR calc Af Amer: >60 mL/min (ref >60)
GLUCOSE: 139 mg/dL — AB (ref 65–99)
POTASSIUM: 3.1 mmol/L — AB (ref 3.5–5.1)
SODIUM: 139 mmol/L (ref 135–145)

## 2016-01-23 LAB — CBC
HCT: 33.8 % — ABNORMAL LOW (ref 36.0–46.0)
HEMOGLOBIN: 11.2 g/dL — AB (ref 12.0–15.0)
MCH: 28 pg (ref 26.0–34.0)
MCHC: 33.1 g/dL (ref 30.0–36.0)
MCV: 84.5 fL (ref 78.0–100.0)
PLATELETS: 257 10*3/uL (ref 150–400)
RBC: 4 MIL/uL (ref 3.87–5.11)
RDW: 13.8 % (ref 11.5–15.5)
WBC: 4.8 10*3/uL (ref 4.0–10.5)

## 2016-01-23 MED ORDER — LORAZEPAM 2 MG/ML IJ SOLN
0.5000 mg | Freq: Four times a day (QID) | INTRAMUSCULAR | Status: DC | PRN
  Administered 2016-01-23: 0.5 mg via INTRAVENOUS
  Filled 2016-01-23: qty 1

## 2016-01-23 MED ORDER — DIPHENHYDRAMINE HCL 25 MG PO CAPS
25.0000 mg | ORAL_CAPSULE | ORAL | Status: DC | PRN
  Administered 2016-01-23: 25 mg via ORAL
  Filled 2016-01-23: qty 1

## 2016-01-23 MED ORDER — SACCHAROMYCES BOULARDII 250 MG PO CAPS
250.0000 mg | ORAL_CAPSULE | Freq: Two times a day (BID) | ORAL | Status: DC
  Administered 2016-01-23 – 2016-01-26 (×7): 250 mg via ORAL
  Filled 2016-01-23 (×7): qty 1

## 2016-01-23 MED ORDER — HYDROXYZINE HCL 25 MG PO TABS
25.0000 mg | ORAL_TABLET | Freq: Three times a day (TID) | ORAL | Status: DC | PRN
  Administered 2016-01-23 – 2016-01-25 (×3): 25 mg via ORAL
  Filled 2016-01-23 (×3): qty 1

## 2016-01-23 MED ORDER — ESCITALOPRAM OXALATE 10 MG PO TABS
10.0000 mg | ORAL_TABLET | Freq: Every day | ORAL | Status: DC
  Administered 2016-01-23 – 2016-01-25 (×3): 10 mg via ORAL
  Filled 2016-01-23 (×4): qty 1

## 2016-01-23 MED ORDER — POTASSIUM CHLORIDE CRYS ER 20 MEQ PO TBCR
40.0000 meq | EXTENDED_RELEASE_TABLET | ORAL | Status: AC
  Administered 2016-01-23 (×2): 40 meq via ORAL
  Filled 2016-01-23 (×2): qty 2

## 2016-01-23 MED ORDER — HYDROXYZINE HCL 10 MG PO TABS: 10.0000 mg | ORAL_TABLET | Freq: Three times a day (TID) | ORAL | Status: DC | PRN

## 2016-01-23 NOTE — Progress Notes (Signed)
EEG Completed; Results Pending  

## 2016-01-23 NOTE — Consult Note (Signed)
Neurology Consultation Reason for Consult: Abnormal M Referring Physician: Regalado, B  CC: Abnormal movements  Interval History 01/23/2016:   History is obtained from family and patient. She has been given Keppra and ativan for anxiety and is having drooling but not altered, following commands, oriented to month and year.  Normal EEG. Still having short-term memory issues. Had a long talk with mother, discussed recent case reports of very similar hippocampal injury in patients using fentanyl-laced drugs. Mother is actually aware of the study as she works in Set [employer]. Patient is improving, no seizure activity, feel can hold off on LP at this time. I also spoke to her attending physician. Changes may be chronic.   HPI 01/23/2016: Brandy Sherman is a 25 y.o. female with a history of drug abuse who presents with unresponsiveness that was presumed to be due to drug overdose. She knows that she had a drug overdose last Monday, and was in the ED for some time and responded well to Narcan. She told her family that she used heroin mixed with cocaine, possibly laced with fentanyl. They talked to her about this on Tuesday and she had memory of the event and seemed in her normal state, but this was over the phone.  On Wednesday, she was admitted here with again presumed drug overdose. She was found unresponsive in a car. She had possible seizure activity en route, and was given Versed by EMS.  On arrival to [facility] cone she had a GCS of 3 UDS was positive for benzos and cocaine. She was intubated and an EEG was performed on 10/12 which just showed changes typical of propofol sedation.  She has greatly improved since admission, and is no longer encephalopathic with the exception of the fact that she has terrible "short-term memory."  She has had multiple episodes concerning for partial seizures over the past few days. Her mother describes that she has stiffening on the left side, with shaking of lower  extremity last for a minute or 2. She does not have recollection of these episodes.     ROS: A 14 point ROS was performed and is negative except as noted in the HPI.   Past Medical History:  Diagnosis Date  . Anxiety   . Clotting disorder (HCC)   . Hepatitis C virus   . HSV-2 (herpes simplex virus 2) infection   . STD (sexually transmitted disease)    HSV II  . 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 Melanoma Mother   . Hypertension Father   . Hyperlipidemia Father   . Diabetes Maternal Grandmother   . Diabetes Maternal Grandfather      Social History:  reports that she has never smoked. She has never used smokeless tobacco. She reports that she uses drugs, including Cocaine. She reports that she does not drink alcohol.   Exam: Current vital signs: BP 131/74 (BP Location: Right Arm)   Pulse 97   Temp 98.9 F (37.2 C) (Oral)   Resp 18   Ht 5\' 6"  (1.676 m)   Wt 80.4 kg (177 lb 4 oz)   LMP 12/01/2015 Comment: i shielded patient  SpO2 94%   BMI 28.61 kg/m  Vital signs in last 24 hours: Temp:  [98.9 F (37.2 C)-99 F (37.2 C)] 98.9 F (37.2 C) (10/16 0519) Pulse Rate:  [93-97] 97 (10/16 0519) Resp:  [18-22] 18 (  10/16 0519) BP: (117-155)/(74-92) 131/74 (10/16 0519) SpO2:  [94 %-100 %] 94 % (10/16 0519) Weight:  [80.4 kg (177 lb 4 oz)] 80.4 kg (177 lb 4 oz) (10/16 0754)  IMPRESSION: personally reviewed images and agree with the following: 1. 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 This is compatible with seizures, potentially related to withdrawal. This may represent status epilepticus. Bilateral ischemic changes are considered much less likely.   Physical Exam  Constitutional: Appears well-developed and well-nourished.  Psych: Affect appropriate to situation Eyes: No scleral injection HENT: No OP  obstrucion Head: Normocephalic.  Cardiovascular: Normal rate and regular rhythm.  Respiratory: Effort normal and breath sounds normal to anterior ascultation GI: Soft.  No distension. There is no tenderness.  Skin: WDI  Neuro: Mental Status: Patient is awake, alert, oriented to person, place, month, year, and situation. No signs of aphasia or neglect She has difficulty with spelling world backwards, but otherwise appears completely clear. Cranial Nerves: II: Visual Fields are full. Pupils are equal, round, and reactive to light.   III,IV, VI: EOMI without ptosis or diploplia.  V: Facial sensation is symmetric to temperature VII: Facial movement is symmetric.  VIII: hearing is intact to voice X: Uvula elevates symmetrically XI: Shoulder shrug is symmetric. XII: tongue is midline without atrophy or fasciculations.  Motor: Tone is normal. Bulk is normal. 5/5 strength was present in all four extremities.  Sensory: Sensation is symmetric to light touch and temperature in the arms and legs. Cerebellar: FNF intact bilaterally  I have reviewed labs in epic and the results pertinent to this consultation are: UDS positive for cocaine. Positive for opiates the previous admission  I have reviewed the images obtained: MRI-bilateral restricted diffusion in the hippocampi.  Impression: 25 year old female with seizures after presumed drug overdose. DDX includes Fentanyl-laced drug injury (see recent case reports in [place] on fentanyl induced hippocampal injury)  Or hypoxic injury due to overdose. Also possible would be that she went into status epilepticus due to cocaine use.  Less commonly, limbic encephalitis can present with restricted diffusion in the bilateral hippocampi. Given her otherwise relatively clear sensorium I think that this is much less likely. I do not think an LP is warranted at this time.    I strongly suspect that the episodes that were felt to be possible dystonic reaction  to Haldol are instead partial seizures.  Recommendations: 1) Keppra 1 g IV 1 was given - now keppra 500 mg twice a day. She is doing well.  2) EEG 01/23/2016: Normal 3)Will hold off on  lumbar puncture 4) if she has any subsequent worsening, will perform LP but at this time she may need repeat MRI in a month and follow up outpatient. [facility].  5) neurology will continue to follow  Discussed with mother at the bedside, she agrees with plan.   Personally examined patient and images, and have participated in and made any corrections needed to history, physical, neuro exam,assessment and plan as stated above.  I have personally obtained the history, evaluated lab date, reviewed imaging studies and agree with radiology interpretations.    Naomie DeanAntonia Ahern, MD Neurohospitalist [phone] [facility]  A total of 35 minutes was spent face-to-face with this patient. Over half this time was spent on counseling patient on the drug overdose and bilateral hippocampal injury diagnosis and different diagnostic and therapeutic options available.     If 7pm- 7am, please page  neurology on call as listed in [place].

## 2016-01-23 NOTE — Procedures (Signed)
ELECTROENCEPHALOGRAM REPORT  Date of Study: 01/23/2016  Patient's Name: Brandy DonathHannah XXXMills MRN: [id number] Date of Birth: June 05, 1990  Referring Provider: Ritta SlotMcNeill Kirkpatrick, MD  Clinical History: 25 year old female with history of drug overdose evaluated for possible partial seizures.  Medications: Hospital Medications  0.9 % sodium chloride infusion   0.9 % sodium chloride infusion   Chlorhexidine Gluconate Cloth 2 % PADS 6 each   feeding supplement (ENSURE ENLIVE) (ENSURE ENLIVE) liquid 237 mL   hydrOXYzine (ATARAX/VISTARIL) tablet 25 mg   levETIRAcetam (KEPPRA) tablet 500 mg   LORazepam (ATIVAN) injection 1 mg   mupirocin ointment (BACTROBAN) 2 % 1 application   piperacillin-tazobactam (ZOSYN) IVPB 3.375 g   traZODone (DESYREL) tablet 100 mg  Outpatient Medications  citalopram (CELEXA) 20 MG tablet   gabapentin (NEURONTIN) 100 MG capsule   naltrexone (DEPADE) 50 MG tablet   traZODone (DESYREL) 100 MG tablet   Levonorgestrel (KYLEENA) 19.5 MG IUD   naproxen sodium (ANAPROX DS) 550 MG tablet   valACYclovir (VALTREX) 500 MG tablet   Technical Summary: A multichannel digital EEG recording measured by the international 10-20 system with electrodes applied with paste and impedances below 5000 ohms performed in our laboratory with EKG monitoring in an awake and drowsy patient.  Hyperventilation and photic stimulation were not performed.  The digital EEG was referentially recorded, reformatted, and digitally filtered in a variety of bipolar and referential montages for optimal display.    Description: The patient is awake and drowsy during the recording.  During maximal wakefulness, there is a symmetric, medium voltage 9 Hz posterior dominant rhythm that attenuates with eye opening.  The record is symmetric.  During drowsiness and sleep, there is an increase in theta slowing of the background.  Vertex waves and symmetric sleep spindles were not seen.  There were no epileptiform  discharges or electrographic seizures seen.    EKG lead was unremarkable.  Impression: This awake and drowsy EEG is normal.    Clinical Correlation: A normal EEG does not exclude a clinical diagnosis of epilepsy.  If further clinical questions remain, prolonged EEG may be helpful.  Clinical correlation is advised.   Shon MilletAdam Jaffe, DO

## 2016-01-23 NOTE — Progress Notes (Addendum)
PROGRESS NOTE    Brandy Sherman  ZOX:[id number] DOB: [birth year] DOA: 01/18/2016 PCP: Hoyt Koch, MD   Brief Narrative:Pt is encephelopathic; therefore, this HPI is obtained from chart review. Brandy Sherman is a 25 y.o. female with PMH as outlined below. She presented to [facility] ED on 10/10 with heroin overdose. She responded well to Narcan and was observed for several hours. She remains stable therefore was discharged home.  On 10/11 she was found unresponsive by a friend in her car. EMS was summoned and upon their arrival, patient had questionable seizure-like activity. She was given 2 mg of Versed and patient then had decerebrate posturing. She was given an additional 2 mg Versed with resolution. Prior to this she had also been given Narcan with no response.  When she arrived [facility] ED, she had persistent AMS with GCS 3. She was subsequently intubated for airway protection. UDS was positive for benzos and cocaine. Patient was found to have multiple track marks throughout her body including left neck/EJ, bilateral upper extremities, bilateral lower extremities.  CTs of the head and neck were negative. CT of the chest only demonstrated right lower lobe opacity concerning for aspiration.    Assessment & Plan:   Principal Problem:   MDD (major depressive disorder), recurrent severe, without psychosis (HCC) Active Problems:   Overdose   Acute hypoxemic respiratory failure (HCC)   Cocaine abuse   Seizure (HCC)   Aspiration pneumonia (HCC)   Hypokalemia   Extrapyramidal syndrome   UTI (urinary tract infection)  1-Overdose, Cocaine, Benzos. Second episode of overdose. Prior overdose with heroin.  Depression  Will start lexapro. If tolerates will need to start Buspar.   Psych consulted. Patient will need  inpatient drug rehabilitation. SW consulted.  Continue with sitter.   2-Acute encephalopathy - due to overdose from above.   Extensive restricted diffusion throughout the  hippocampus ; likely related to drugs overdose fentanyl, heroin.  ? Seizure like activity on admission .  B 12; normal, TSH normal. Ammonia normal.  on tazadone PRN.  MRI brain; extensive restricted diffusion throughout the hippocampus.  EEG on admission negative for seizure. Repeated EEG 10-15 ;There were no epileptiform discharges or electrographic seizures seen.   MRI concerning with seizure ?, RN report an episode where the patient was drooling , no responsive, extending legs. Patient received ativan at that time. Neurology was consulted.  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 Probably was having partial seizure. Continue with Keppra. Patient will need repeated MRI follow up outpatient.   3-Respiratory Failure secondary to overdose. Required intubation during this admission. with inability to protect airway in setting of altered mental status due to overdose. S/P extubation 10-13. Respiratory status stable.   Aspiration PNA;  Continue with Zosyn. Day 4.   UTI, E coli.  On zosyn.   Elevated troponin; in setting of cocaine use. Trending down. Check ECHO./   Hypomagnesemia; replete IV on 10-15 Repeat labs in am. .  Hypokalemia; replete with 40 meq.   Diarrhea, Had episode of diarrhea. Will discontinue antibiotics. If persist might need to check for C diff.    Mild transaminases;  Normalized.  Screening for HIV non reactive.   DVT prophylaxis: heparin  Code Status: full code.  Family Communication: Mother at bedside,  Disposition Plan: remain inpatient. Psych consulted   Consultants:   CCM admitted patient.   Psych    Procedures:   Intubation on admission, Extubation 10-13.     Antimicrobials:  Zosyn 10-12   Subjective: She is alert this morning. Oriented to place and person. Knows  she is in the hospital Still having problems with short term memory/   Objective: Vitals:   01/22/16 1240 01/22/16 2124  01/23/16 0519 01/23/16 0754  BP: (!) 155/92 117/79 131/74   Pulse: 95 93 97   Resp:  (!) 22 18   Temp:  99 F (37.2 C) 98.9 F (37.2 C)   TempSrc:  Oral Oral   SpO2: 100% 99% 94%   Weight:    80.4 kg (177 lb 4 oz)  Height:        Intake/Output Summary (Last 24 hours) at 01/23/16 1002 Last data filed at 01/22/16 1819  Gross per 24 hour  Intake              930 ml  Output                0 ml  Net              930 ml   Filed Weights   01/21/16 0620 01/22/16 0700 01/23/16 0754  Weight: 77.2 kg (170 lb 3.1 oz) 75.9 kg (167 lb 5.3 oz) 80.4 kg (177 lb 4 oz)    Examination:  General exam: Appears calm and comfortable  Respiratory system: Clear to auscultation. Respiratory effort normal. Cardiovascular system: S1 & S2 heard, RRR. No JVD, murmurs, rubs, gallops or clicks. No pedal edema. Gastrointestinal system: Abdomen is nondistended, soft and nontender. No organomegaly or masses felt. Normal bowel sounds heard. Central nervous system: Alert and oriented. No focal neurological deficits. Extremities: Symmetric 5 x 5 power. Skin: No rashes, lesions or ulcers Psychiatry: Judgement and insight appear normal. Mood & affect appropriate.     Data Reviewed: I have personally reviewed following labs and imaging studies  CBC:  Recent Labs Lab 01/18/16 2001 01/19/16 0602 01/21/16 0802 01/22/16 0523 01/23/16 0349  WBC 11.4* 8.7 5.2 3.9* 4.8  NEUTROABS 10.4*  --   --   --   --   HGB 11.6* 10.1* 11.0* 11.1* 11.2*  HCT 35.0* 30.7* 32.8* 33.1* 33.8*  MCV 87.9 86.2 84.5 84.4 84.5  PLT 228 231 208 212 257   Basic Metabolic Panel:  Recent Labs Lab 01/18/16 2001 01/19/16 0602 01/21/16 0802 01/22/16 0523 01/23/16 0349  NA 136 138 141 139 139  K 3.9 3.2* 2.8* 3.8 3.1*  CL 104 107 108 111 111  CO2 21* 23 24 20* 21*  GLUCOSE 241* 79 75 88 139*  BUN 6 7 <5* <5* <5*  CREATININE 1.27* 0.89 0.76 0.71 0.70  CALCIUM 8.1* 7.7* 7.9* 8.3* 8.7*  MG  --  1.7 1.6* 1.8  --   PHOS  --   1.6* 2.5  --   --    GFR: Estimated Creatinine Clearance: 117.9 mL/min (by C-G formula based on SCr of 0.7 mg/dL). Liver Function Tests:  Recent Labs Lab 01/17/16 0130 01/18/16 2001 01/22/16 0523  AST 52* 64* 38  ALT 72* 73* 47  ALKPHOS 37* 62 32*  BILITOT 0.5 0.5 0.4  PROT 8.0 6.7 6.4*  ALBUMIN 4.0 3.3* 2.8*   No results for input(s): LIPASE, AMYLASE in the last 168 hours.  Recent Labs Lab 01/22/16 0523  AMMONIA 29   Coagulation Profile: No results for input(s): INR, PROTIME in the last 168 hours. Cardiac Enzymes:  Recent Labs Lab 01/19/16 0200 01/19/16 0602 01/19/16 1235  TROPONINI 0.88* 0.85* 0.48*   BNP (last 3 results) No  results for input(s): PROBNP in the last 8760 hours. HbA1C: No results for input(s): HGBA1C in the last 72 hours. CBG:  Recent Labs Lab 01/20/16 0805 01/20/16 1217 01/20/16 1559 01/20/16 2030 01/21/16 1901  GLUCAP 75 71 74 86 199*   Lipid Profile: No results for input(s): CHOL, HDL, LDLCALC, TRIG, CHOLHDL, LDLDIRECT in the last 72 hours. Thyroid Function Tests:  Recent Labs  01/21/16 1220  TSH 2.131   Anemia Panel:  Recent Labs  01/21/16 1220  VITAMINB12 565   Sepsis Labs:  Recent Labs Lab 01/19/16 0200  PROCALCITON 1.95  LATICACIDVEN 1.1    Recent Results (from the past 240 hour(s))  MRSA PCR Screening     Status: Abnormal   Collection Time: 01/19/16  1:51 AM  Result Value Ref Range Status   MRSA by PCR POSITIVE (A) NEGATIVE Final    Comment:        The GeneXpert MRSA Assay (FDA approved for NASAL specimens only), is one component of a comprehensive MRSA colonization surveillance program. It is not intended to diagnose MRSA infection nor to guide or monitor treatment for MRSA infections. RESULT CALLED TO, READ BACK BY AND VERIFIED WITH: Sheliah Plane AT 1610 01/19/16 BY M KELLY   Culture, blood (routine x 2)     Status: None (Preliminary result)   Collection Time: 01/19/16  2:32 AM  Result Value Ref  Range Status   Specimen Description BLOOD LEFT THUMB  Final   Special Requests IN PEDIATRIC BOTTLE  Final   Culture NO GROWTH 3 DAYS  Final   Report Status PENDING  Incomplete  Culture, Urine     Status: Abnormal   Collection Time: 01/19/16  2:40 AM  Result Value Ref Range Status   Specimen Description URINE, CATHETERIZED  Final   Special Requests NONE  Final   Culture >=100,000 COLONIES/mL ESCHERICHIA COLI (A)  Final   Report Status 01/21/2016 FINAL  Final   Organism ID, Bacteria ESCHERICHIA COLI (A)  Final      Susceptibility   Escherichia coli - MIC*    AMPICILLIN >=32 RESISTANT Resistant     CEFAZOLIN <=4 SENSITIVE Sensitive     CEFTRIAXONE <=1 SENSITIVE Sensitive     CIPROFLOXACIN <=0.25 SENSITIVE Sensitive     GENTAMICIN >=16 RESISTANT Resistant     IMIPENEM <=0.25 SENSITIVE Sensitive     NITROFURANTOIN <=16 SENSITIVE Sensitive     TRIMETH/SULFA >=320 RESISTANT Resistant     AMPICILLIN/SULBACTAM 16 INTERMEDIATE Intermediate     PIP/TAZO <=4 SENSITIVE Sensitive     Extended ESBL NEGATIVE Sensitive     * >=100,000 COLONIES/mL ESCHERICHIA COLI  Culture, blood (routine x 2)     Status: None (Preliminary result)   Collection Time: 01/19/16  2:42 AM  Result Value Ref Range Status   Specimen Description BLOOD RIGHT HAND  Final   Special Requests AEROBIC BOTTLE ONLY  Final   Culture NO GROWTH 3 DAYS  Final   Report Status PENDING  Incomplete         Radiology Studies: Mr Brain 26 Contrast  Result Date: 01/22/2016 CLINICAL DATA:  Drug overdose with higher when. Seizure-like activity. EXAM: MRI HEAD WITHOUT CONTRAST TECHNIQUE: Multiplanar, multiecho pulse sequences of the brain and surrounding structures were obtained without intravenous contrast. COMPARISON:  CT head without contrast 01/18/2016 FINDINGS: Brain: Profound restricted diffusion is evident within the medial temporal lobes. Abnormal signal is present throughout the hippocampus. Increased T2 signal is noted  as well. No other significant white matter  changes are present in the brain. The brainstem and cerebellum are normal. No other acute infarct is present. The ventricles are of normal size. No significant extra-axial fluid collection is present. Vascular: Flow is present in the major intracranial arteries. Skull and upper cervical spine: The skullbase is within normal limits. Midline sagittal structures are otherwise unremarkable. Sinuses/Orbits: Polyps in mucosal thickening are noted along the floor of the maxillary sinuses bilaterally. The paranasal sinuses are otherwise clear. The globes and orbits are intact. IMPRESSION: 1. 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 This is compatible with seizures, potentially related to withdrawal. This may represent status epilepticus. Bilateral ischemic changes are considered much less likely. Electronically Signed   By: Marin Roberts M.D.   On: 01/22/2016 10:51        Scheduled Meds: . Chlorhexidine Gluconate Cloth  6 each Topical Q0600  . feeding supplement (ENSURE ENLIVE)  237 mL Oral BID BM  . levETIRAcetam  500 mg Oral BID  . mupirocin ointment  1 application Nasal BID  . piperacillin-tazobactam (ZOSYN)  IV  3.375 g Intravenous Q8H   Continuous Infusions: . sodium chloride 100 mL/hr at 01/23/16 0148     LOS: 5 days    Time spent: 35 minutes.     Alba Cory, MD [facility] Pager [phone]  If 7PM-7AM, please contact night-coverage [email or web address] Password [email or web address] 01/23/2016, 10:02 AM

## 2016-01-24 ENCOUNTER — Inpatient Hospital Stay (HOSPITAL_COMMUNITY): Payer: PRIVATE HEALTH INSURANCE

## 2016-01-24 DIAGNOSIS — R7989 Other specified abnormal findings of blood chemistry: Secondary | ICD-10-CM

## 2016-01-24 LAB — BASIC METABOLIC PANEL
ANION GAP: 6 (ref 5–15)
BUN: <5 mg/dL — ABNORMAL LOW (ref 6–20)
CALCIUM: 9.1 mg/dL (ref 8.9–10.3)
CO2: 24 mmol/L (ref 22–32)
CREATININE: 0.75 mg/dL (ref 0.44–1.00)
Chloride: 109 mmol/L (ref 101–111)
GLUCOSE: 87 mg/dL (ref 65–99)
Potassium: 3.9 mmol/L (ref 3.5–5.1)
Sodium: 139 mmol/L (ref 135–145)

## 2016-01-24 LAB — CULTURE, BLOOD (ROUTINE X 2)
Culture: NO GROWTH
Culture: NO GROWTH

## 2016-01-24 LAB — ECHOCARDIOGRAM COMPLETE
Height: 66 in
WEIGHTICAEL: 2585.55 [oz_av]

## 2016-01-24 MED ORDER — BUSPIRONE HCL 15 MG PO TABS
7.5000 mg | ORAL_TABLET | Freq: Two times a day (BID) | ORAL | Status: DC
  Administered 2016-01-24 – 2016-01-26 (×4): 7.5 mg via ORAL
  Filled 2016-01-24 (×5): qty 1

## 2016-01-24 MED ORDER — LORATADINE 10 MG PO TABS
10.0000 mg | ORAL_TABLET | Freq: Every day | ORAL | Status: DC | PRN
  Administered 2016-01-24 – 2016-01-25 (×2): 10 mg via ORAL
  Filled 2016-01-24 (×2): qty 1

## 2016-01-24 MED ORDER — FLUTICASONE PROPIONATE 50 MCG/ACT NA SUSP
1.0000 | Freq: Every day | NASAL | Status: DC
  Administered 2016-01-24 – 2016-01-26 (×3): 1 via NASAL
  Filled 2016-01-24: qty 16

## 2016-01-24 MED ORDER — ENOXAPARIN SODIUM 40 MG/0.4ML ~~LOC~~ SOLN
40.0000 mg | SUBCUTANEOUS | Status: DC
  Administered 2016-01-24 – 2016-01-25 (×2): 40 mg via SUBCUTANEOUS
  Filled 2016-01-24 (×2): qty 0.4

## 2016-01-24 MED ORDER — ACETAMINOPHEN 325 MG PO TABS
650.0000 mg | ORAL_TABLET | Freq: Four times a day (QID) | ORAL | Status: DC | PRN
  Administered 2016-01-24 – 2016-01-25 (×2): 650 mg via ORAL
  Filled 2016-01-24 (×2): qty 2

## 2016-01-24 NOTE — Progress Notes (Signed)
RN left message for Beverely PaceBryant, Child psychotherapistsocial worker, about the status of placement for the patient. Awaiting a return call.

## 2016-01-24 NOTE — Progress Notes (Signed)
  Echocardiogram 2D Echocardiogram has been performed.  Cathie BeamsGREGORY, Craven Crean 01/24/2016, 3:47 PM

## 2016-01-24 NOTE — Progress Notes (Signed)
Subjective: Cognitively improved but still having difficulty with short term memory. Going to Rehab for both addiction and OT hopefully after D/C  Exam: Vitals:   01/23/16 2159 01/24/16 0554  BP: 118/79 (!) 100/56  Pulse: (!) 103 86  Resp: 18 16  Temp: 98.7 F (37.1 C) 98.9 F (37.2 C)    Mental Status: Patient is awake, alert, oriented to person, place, month, year, and situation. No signs of aphasia or neglect  Cranial Nerves: II: Visual Fields are full. Pupils are equal, round, and reactive to light.   III,IV, VI: EOMI without ptosis or diploplia.  V: Facial sensation is symmetric to temperature VII: Facial movement is symmetric.  VIII: hearing is intact to voice X: Uvula elevates symmetrically XI: Shoulder shrug is symmetric. XII: tongue is midline without atrophy or fasciculations.  Motor: Tone is normal. Bulk is normal. 5/5 strength was present in all four extremities.  Sensory: Sensation is symmetric to light touch and temperature in the arms and legs. Cerebellar: FNF intact bilaterally     Pertinent Labs/Diagnostics: EEG showed no epileptiform activity    Impression: 25 year old female with seizures after presumed drug overdose. DDX includes Fentanyl-laced drug injury (see recent case reports in [place] on fentanyl induced hippocampal injury)  Or hypoxic injury due to overdose. Also possible would be that she went into status epilepticus due to cocaine use.   Recommendations: 1) Continue Keppra and follow up with neurology out patient in 2 weeks.  2) No driving, operating heavy machinery, perform activities at heights, swimming or participation in water activities until release by outpatient physician.  This has been discussed with patient.   Neurology S/O  Felicie MornDavid Renesmee Raine PA-C [facility] [phone] 01/24/2016, 1:03 PM

## 2016-01-24 NOTE — Progress Notes (Addendum)
PROGRESS NOTE    Brandy Sherman  WUJ:[id number] DOB: 14-Feb-1991 DOA: 01/18/2016 PCP: Hoyt KochYOUSEF, DEEMA, MD   Brief Narrative:Pt is encephelopathic; therefore, this HPI is obtained from chart review. Brandy CoopHannah Sherman is a 25 y.o. female with PMH as outlined below. [facility] on 10/10 with heroin overdose. She responded well to Narcan and was observed for several hours. She remains stable therefore was discharged home.  On 10/11 she was found unresponsive by a friend in her car. EMS was summoned and upon their arrival, patient had questionable seizure-like activity. She was given 2 mg of Versed and patient then had decerebrate posturing. She was given an additional 2 mg Versed with resolution. Prior to this she had also been given Narcan with no response.  When [facility] ED, she had persistent AMS with GCS 3. She was subsequently intubated for airway protection. UDS was positive for benzos and cocaine. Patient was found to have multiple track marks throughout her body including left neck/EJ, bilateral upper extremities, bilateral lower extremities.  CTs of the head and neck were negative. CT of the chest only demonstrated right lower lobe opacity concerning for aspiration.    Assessment & Plan:   Principal Problem:   MDD (major depressive disorder), recurrent severe, without psychosis (HCC) Active Problems:   Overdose   Acute hypoxemic respiratory failure (HCC)   Cocaine abuse   Seizure (HCC)   Aspiration pneumonia (HCC)   Hypokalemia   Extrapyramidal syndrome   UTI (urinary tract infection)  1-Overdose, Cocaine, Benzos. Second episode of overdose. Prior overdose with heroin.  Depression  Started  lexapro 10-16 . Will start Buspar today.    Psych consulted. Patient will need  inpatient drug rehabilitation. SW consulted.  Continue with sitter.   2-Acute encephalopathy - due to overdose from above.   Extensive restricted diffusion throughout the hippocampus  ; likely related to drugs overdose fentanyl, heroin.  ? Seizure like activity on admission .  B 12; normal, TSH normal. Ammonia normal.  on tazadone PRN.  MRI brain; extensive restricted diffusion throughout the hippocampus.  EEG on admission negative for seizure. Repeated EEG 10-15 ;There were no epileptiform discharges or electrographic seizures seen.   MRI concerning with seizure ?, RN report an episode where the patient was drooling , no responsive, extending legs. Patient received ativan at that time. Neurology was consulted.  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 Probably was having partial seizure. Continue with Keppra. Patient will need repeated MRI follow up outpatient.   3-Respiratory Failure secondary to overdose. Required intubation during this admission. with inability to protect airway in setting of altered mental status due to overdose. S/P extubation 10-13. Respiratory status stable.   Aspiration PNA;  Received  Zosyn. Day 4.   UTI, E coli. Treated.  On zosyn.   Elevated troponin; in setting of cocaine use. Trending down. Check ECHO./   Hypomagnesemia; replete IV on 10-15 Repeat labs in am. .  Hypokalemia; replete with 40 meq.   Diarrhea, resolved.    Mild transaminases;  Normalized.  Screening for HIV non reactive.   DVT prophylaxis: heparin  Code Status: full code.  Family Communication: Mother at bedside,  Disposition Plan: medical clear, awaiting rehab  Consultants:   CCM admitted patient.   Psych    Procedures:   Intubation on admission, Extubation 10-13.     Antimicrobials:  Zosyn 10-12   Subjective: She was sleepy this morning, wake up answer questions.  She knew she was in the hospital and why. Remember that she saw her dad 2 days ago.   Objective: Vitals:   01/23/16 1300 01/23/16 2159 01/24/16 0554 01/24/16 0700  BP: 128/67 118/79 (!) 100/56   Pulse: 87 (!) 103 86   Resp: 16 18  16    Temp: 98.1 F (36.7 C) 98.7 F (37.1 C) 98.9 F (37.2 C)   TempSrc: Oral Oral Oral   SpO2: 100% 100% 98%   Weight:    73.3 kg (161 lb 9.6 oz)  Height:        Intake/Output Summary (Last 24 hours) at 01/24/16 1240 Last data filed at 01/24/16 1037  Gross per 24 hour  Intake             1040 ml  Output                0 ml  Net             1040 ml   Filed Weights   01/22/16 0700 01/23/16 0754 01/24/16 0700  Weight: 75.9 kg (167 lb 5.3 oz) 80.4 kg (177 lb 4 oz) 73.3 kg (161 lb 9.6 oz)    Examination:  General exam: Appears calm and comfortable  Respiratory system: Clear to auscultation. Respiratory effort normal. Cardiovascular system: S1 & S2 heard, RRR. No JVD, murmurs, rubs, gallops or clicks. No pedal edema. Gastrointestinal system: Abdomen is nondistended, soft and nontender. No organomegaly or masses felt. Normal bowel sounds heard. Central nervous system: Alert and oriented. No focal neurological deficits. Extremities: Symmetric 5 x 5 power. Skin: No rashes, lesions or ulcers Psychiatry: Judgement and insight appear normal. Mood & affect appropriate.     Data Reviewed: I have personally reviewed following labs and imaging studies  CBC:  Recent Labs Lab 01/18/16 2001 01/19/16 0602 01/21/16 0802 01/22/16 0523 01/23/16 0349  WBC 11.4* 8.7 5.2 3.9* 4.8  NEUTROABS 10.4*  --   --   --   --   HGB 11.6* 10.1* 11.0* 11.1* 11.2*  HCT 35.0* 30.7* 32.8* 33.1* 33.8*  MCV 87.9 86.2 84.5 84.4 84.5  PLT 228 231 208 212 257   Basic Metabolic Panel:  Recent Labs Lab 01/19/16 0602 01/21/16 0802 01/22/16 0523 01/23/16 0349 01/24/16 0841  NA 138 141 139 139 139  K 3.2* 2.8* 3.8 3.1* 3.9  CL 107 108 111 111 109  CO2 23 24 20* 21* 24  GLUCOSE 79 75 88 139* 87  BUN 7 <5* <5* <5* <5*  CREATININE 0.89 0.76 0.71 0.70 0.75  CALCIUM 7.7* 7.9* 8.3* 8.7* 9.1  MG 1.7 1.6* 1.8  --   --   PHOS 1.6* 2.5  --   --   --    GFR: Estimated Creatinine Clearance: 113 mL/min  (by C-G formula based on SCr of 0.75 mg/dL). Liver Function Tests:  Recent Labs Lab 01/18/16 2001 01/22/16 0523  AST 64* 38  ALT 73* 47  ALKPHOS 62 32*  BILITOT 0.5 0.4  PROT 6.7 6.4*  ALBUMIN 3.3* 2.8*   No results for input(s): LIPASE, AMYLASE in the last 168 hours.  Recent Labs Lab 01/22/16 0523  AMMONIA 29   Coagulation Profile: No results for input(s): INR, PROTIME in the last 168 hours. Cardiac Enzymes:  Recent Labs Lab 01/19/16 0200 01/19/16 0602 01/19/16 1235  TROPONINI 0.88* 0.85* 0.48*   BNP (last 3 results) No results for input(s): PROBNP in the last 8760 hours. HbA1C: No results for input(s): HGBA1C in the last  72 hours. CBG:  Recent Labs Lab 01/20/16 0805 01/20/16 1217 01/20/16 1559 01/20/16 2030 01/21/16 1901  GLUCAP 75 71 74 86 199*   Lipid Profile: No results for input(s): CHOL, HDL, LDLCALC, TRIG, CHOLHDL, LDLDIRECT in the last 72 hours. Thyroid Function Tests: No results for input(s): TSH, T4TOTAL, FREET4, T3FREE, THYROIDAB in the last 72 hours. Anemia Panel: No results for input(s): VITAMINB12, FOLATE, FERRITIN, TIBC, IRON, RETICCTPCT in the last 72 hours. Sepsis Labs:  Recent Labs Lab 01/19/16 0200  PROCALCITON 1.95  LATICACIDVEN 1.1    Recent Results (from the past 240 hour(s))  MRSA PCR Screening     Status: Abnormal   Collection Time: 01/19/16  1:51 AM  Result Value Ref Range Status   MRSA by PCR POSITIVE (A) NEGATIVE Final    Comment:        The GeneXpert MRSA Assay (FDA approved for NASAL specimens only), is one component of a comprehensive MRSA colonization surveillance program. It is not intended to diagnose MRSA infection nor to guide or monitor treatment for MRSA infections. RESULT CALLED TO, READ BACK BY AND VERIFIED WITH: Sheliah Plane AT 1610 01/19/16 BY M KELLY   Culture, blood (routine x 2)     Status: None (Preliminary result)   Collection Time: 01/19/16  2:32 AM  Result Value Ref Range Status    Specimen Description BLOOD LEFT THUMB  Final   Special Requests IN PEDIATRIC BOTTLE  Final   Culture NO GROWTH 4 DAYS  Final   Report Status PENDING  Incomplete  Culture, Urine     Status: Abnormal   Collection Time: 01/19/16  2:40 AM  Result Value Ref Range Status   Specimen Description URINE, CATHETERIZED  Final   Special Requests NONE  Final   Culture >=100,000 COLONIES/mL ESCHERICHIA COLI (A)  Final   Report Status 01/21/2016 FINAL  Final   Organism ID, Bacteria ESCHERICHIA COLI (A)  Final      Susceptibility   Escherichia coli - MIC*    AMPICILLIN >=32 RESISTANT Resistant     CEFAZOLIN <=4 SENSITIVE Sensitive     CEFTRIAXONE <=1 SENSITIVE Sensitive     CIPROFLOXACIN <=0.25 SENSITIVE Sensitive     GENTAMICIN >=16 RESISTANT Resistant     IMIPENEM <=0.25 SENSITIVE Sensitive     NITROFURANTOIN <=16 SENSITIVE Sensitive     TRIMETH/SULFA >=320 RESISTANT Resistant     AMPICILLIN/SULBACTAM 16 INTERMEDIATE Intermediate     PIP/TAZO <=4 SENSITIVE Sensitive     Extended ESBL NEGATIVE Sensitive     * >=100,000 COLONIES/mL ESCHERICHIA COLI  Culture, blood (routine x 2)     Status: None (Preliminary result)   Collection Time: 01/19/16  2:42 AM  Result Value Ref Range Status   Specimen Description BLOOD RIGHT HAND  Final   Special Requests AEROBIC BOTTLE ONLY  Final   Culture NO GROWTH 4 DAYS  Final   Report Status PENDING  Incomplete         Radiology Studies: No results found.      Scheduled Meds: . escitalopram  10 mg Oral QHS  . feeding supplement (ENSURE ENLIVE)  237 mL Oral BID BM  . levETIRAcetam  500 mg Oral BID  . saccharomyces boulardii  250 mg Oral BID   Continuous Infusions:     LOS: 6 days    Time spent: 35 minutes.     Alba Cory, MD [facility] Pager [phone]  If 7PM-7AM, please contact night-coverage [email or web address] Password [email or web address] 01/24/2016, 12:40 PM

## 2016-01-24 NOTE — Clinical Social Work Note (Signed)
Referral made to Chrissie NoaWilliam with [facility] per patient and family's request. CSW notes that psychiatry recommended inpatient psych at time of discharge. CSW explained to MD that the patient would have to be cleared by psychiatry before a facility like [facility] would be willing to accept the patient for substance abuse treatment. CSW will continue to follow.  Roddie McBryant Belinda Schlichting MSW, ParkerLCSW, [place], [phone]

## 2016-01-24 NOTE — Progress Notes (Signed)
RN called echo lab to inquire about when the patient's Echo will be completed. No answer. Will attempt to call again.

## 2016-01-25 DIAGNOSIS — R413 Other amnesia: Secondary | ICD-10-CM

## 2016-01-25 DIAGNOSIS — T401X4A Poisoning by heroin, undetermined, initial encounter: Secondary | ICD-10-CM

## 2016-01-25 DIAGNOSIS — T402X1A Poisoning by other opioids, accidental (unintentional), initial encounter: Secondary | ICD-10-CM

## 2016-01-25 DIAGNOSIS — Z888 Allergy status to other drugs, medicaments and biological substances status: Secondary | ICD-10-CM

## 2016-01-25 LAB — BASIC METABOLIC PANEL
ANION GAP: 8 (ref 5–15)
BUN: <5 mg/dL — ABNORMAL LOW (ref 6–20)
CALCIUM: 9.1 mg/dL (ref 8.9–10.3)
CO2: 25 mmol/L (ref 22–32)
CREATININE: 0.68 mg/dL (ref 0.44–1.00)
Chloride: 104 mmol/L (ref 101–111)
Glucose, Bld: 86 mg/dL (ref 65–99)
Potassium: 3.6 mmol/L (ref 3.5–5.1)
SODIUM: 137 mmol/L (ref 135–145)

## 2016-01-25 LAB — MAGNESIUM: MAGNESIUM: 1.8 mg/dL (ref 1.7–2.4)

## 2016-01-25 MED ORDER — PHENOL 1.4 % MT LIQD
1.0000 | OROMUCOSAL | Status: DC | PRN
  Administered 2016-01-25: 1 via OROMUCOSAL
  Filled 2016-01-25: qty 177

## 2016-01-25 MED ORDER — HYDROCOD POLST-CPM POLST ER 10-8 MG/5ML PO SUER
5.0000 mL | Freq: Once | ORAL | Status: AC
  Administered 2016-01-25: 5 mL via ORAL
  Filled 2016-01-25: qty 5

## 2016-01-25 MED ORDER — WHITE PETROLATUM GEL
Status: AC
  Administered 2016-01-25: 16:00:00
  Filled 2016-01-25: qty 1

## 2016-01-25 NOTE — Consult Note (Signed)
[place] Psychiatry Consult   Reason for Consult:  Heroin overdose, polysubstance abuse and cognitive deficits Referring Physician:  [place] Patient Identification: Brandy Sherman MRN:  [id number] Principal Diagnosis: MDD (major depressive disorder), recurrent severe, without psychosis ([place]) Diagnosis:   Patient Active Problem List   Diagnosis Date Noted  . Aspiration pneumonia ([place]) [J69.0] 01/22/2016  . Hypokalemia [E87.6] 01/22/2016  . Extrapyramidal syndrome [G25.9] 01/22/2016  . UTI (urinary tract infection) [N39.0] 01/22/2016  . MDD (major depressive disorder), recurrent severe, without psychosis ([place]) [F33.2] 01/22/2016  . Overdose [T50.901A] 01/19/2016  . Acute hypoxemic respiratory failure ([place]) [J96.01]   . Cocaine abuse [F14.10]   . AKI (acute kidney injury) ([place]) [N17.9]   . Seizure ([place]) [R56.9]   . Dysmenorrhea [N94.6] 12/07/2015  . 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 Hepatitis, viral [B19.9] 12/07/2015  . History of substance abuse [Z87.898] 12/07/2015    Total Time spent with patient: 60 minutes   Subjective:   Brandy Sherman is a 25 y.o. female  admitted with reports of second overdose within a few days requiring hospital admission with encephalopathy.   HPI:  Brandy Sherman is a 25 y.o. female seen, chart reviewed for the face-to-face psychiatric consultation and evaluation of polysubstance dependence, recent relapse on heroin and admitted to [facility] with encephalopathy secondary to heroin overdose and also urine drug screen positive for benzodiazepines and cocaine. Reportedly patient had epileptic seizure possibly withdrawn from the proximal. Reviewed neurologically evaluation which indicated hypoxic injury to hippocampal area which leads to short-term memory loss. Patient can remember everything happened in her past but could not tell me about the short-term events including work she is having for breakfast lunch and dinner. Patient continued to report  " I do not remember" which is frustrating her. Patient mother was at bedside provided information regarding previous admission to [facility]. She stated 4 weeks and then [place] where she stated 5-6 months while working in the [place] place until she relapsed on drug of overdose which is a heroin. Patient was kicked out of the [employer] and lost her job because of not showing up for the work. Patient reportedly using drugs since she was 46 years old and reportedly had a boyfriend with the drug abuse who introduced her. Patient blames herself for drug abuse and also reported she was prostituted once and worked as a stripper in the past. 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    Past Psychiatric History: MDD, opiate dependence and polysubstance abuse.  Risk to Self: Is patient at risk for suicide?: Yes Risk to Others:   Prior Inpatient Therapy:   Prior Outpatient Therapy:    Past Medical History:  Past Medical History:  Diagnosis Date  . Anxiety   . Clotting disorder ([place])   . Hepatitis C virus   . HSV-2 (herpes simplex virus 2) infection   . STD (sexually transmitted disease)    HSV II  . 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 arm surgery Right   . WRIST SURGERY Left    abcess    Family History:  Family History  Problem Relation Age of Onset  . Melanoma Mother   . Hypertension Father   . Hyperlipidemia Father   . Diabetes Maternal Grandmother   .  Diabetes Maternal Grandfather    Family Psychiatric  History: MDD Social History:  History  Alcohol Use No     History  Drug Use  . Types: Cocaine    Comment: was clean for over 200 days prior to [facility] 2017; heroin, crack, cocaine use    Social History   Social History  . Marital status: Single    Spouse name: N/A  .  Number of children: N/A  . Years of education: N/A   Social History Main Topics  . Smoking status: Never Smoker  . Smokeless tobacco: Never Used  . Alcohol use No  . Drug use:     Types: Cocaine     Comment: was clean for over 200 days prior to [facility] 2017; heroin, crack, cocaine use  . Sexual activity: Yes    Partners: Male    Birth control/ protection: IUD   Other Topics Concern  . None   Social History Narrative  . None   Additional Social History:    Allergies:   Allergies  Allergen Reactions  . Haldol [Haloperidol Lactate] Other (See Comments)    Patient develops dystonia, extrapyramidal syndrome.     Labs:  Results for orders placed or performed during the hospital encounter of 01/18/16 (from the past 48 hour(s))  Basic metabolic panel     Status: Abnormal   Collection Time: 01/24/16  8:41 AM  Result Value Ref Range   Sodium 139 135 - 145 mmol/L   Potassium 3.9 3.5 - 5.1 mmol/L   Chloride 109 101 - 111 mmol/L   CO2 24 22 - 32 mmol/L   Glucose, Bld 87 65 - 99 mg/dL   BUN <5 (L) 6 - 20 mg/dL   Creatinine, Ser 0.75 0.44 - 1.00 mg/dL   Calcium 9.1 8.9 - 10.3 mg/dL   GFR calc non Af Amer >60 >60 mL/min   GFR calc Af Amer >60 >60 mL/min    Comment: (NOTE) The eGFR has been calculated using the CKD EPI equation. This calculation has not been validated in all clinical situations. eGFR's persistently <60 mL/min signify possible Chronic Kidney Disease.    Anion gap 6 5 - 15  Magnesium     Status: None   Collection Time: 01/25/16  4:58 AM  Result Value Ref Range   Magnesium 1.8 1.7 - 2.4 mg/dL  Basic metabolic panel     Status: Abnormal   Collection Time: 01/25/16  4:58 AM  Result Value Ref Range   Sodium 137 135 - 145 mmol/L   Potassium 3.6 3.5 - 5.1 mmol/L   Chloride 104 101 - 111 mmol/L   CO2 25 22 - 32 mmol/L   Glucose, Bld 86 65 - 99 mg/dL   BUN <5 (L) 6 - 20 mg/dL   Creatinine, Ser 0.68 0.44 - 1.00 mg/dL   Calcium 9.1 8.9 - 10.3 mg/dL   GFR calc  non Af Amer >60 >60 mL/min   GFR calc Af Amer >60 >60 mL/min    Comment: (NOTE) The eGFR has been calculated using the CKD EPI equation. This calculation has not been validated in all clinical situations. eGFR's persistently <60 mL/min signify possible Chronic Kidney Disease.    Anion gap 8 5 - 15    Current Facility-Administered Medications  Medication Dose Route Frequency Provider Last Rate Last Dose  . acetaminophen (TYLENOL) tablet 650 mg  650 mg Oral Q6H PRN Belkys A Regalado, MD   650 mg at 01/24/16 1744  . busPIRone (BUSPAR) tablet  7.5 mg  7.5 mg Oral BID Belkys A Regalado, MD   7.5 mg at 01/25/16 1112  . enoxaparin (LOVENOX) injection 40 mg  40 mg Subcutaneous Q24H Belkys A Regalado, MD   40 mg at 01/24/16 1744  . escitalopram (LEXAPRO) tablet 10 mg  10 mg Oral QHS Belkys A Regalado, MD   10 mg at 01/24/16 2042  . feeding supplement (ENSURE ENLIVE) (ENSURE ENLIVE) liquid 237 mL  237 mL Oral BID BM Belkys A Regalado, MD   Stopped at 01/24/16 1400  . fluticasone (FLONASE) 50 MCG/ACT nasal spray 1 spray  1 spray Each Nare Daily Belkys A Regalado, MD   1 spray at 01/25/16 1121  . hydrOXYzine (ATARAX/VISTARIL) tablet 25 mg  25 mg Oral TID PRN Belkys A Regalado, MD   25 mg at 01/24/16 2116  . levETIRAcetam (KEPPRA) tablet 500 mg  500 mg Oral BID Greta Doom, MD   500 mg at 01/25/16 1113  . loratadine (CLARITIN) tablet 10 mg  10 mg Oral Daily PRN Belkys A Regalado, MD   10 mg at 01/25/16 1121  . LORazepam (ATIVAN) injection 0.5 mg  0.5 mg Intravenous Q6H PRN Belkys A Regalado, MD   0.5 mg at 01/23/16 2021  . saccharomyces boulardii (FLORASTOR) capsule 250 mg  250 mg Oral BID Belkys A Regalado, MD   250 mg at 01/25/16 1111  . traZODone (DESYREL) tablet 100 mg  100 mg Oral QHS PRN Mauri Brooklyn, MD   100 mg at 01/24/16 2042    Musculoskeletal: Strength & Muscle Tone: within normal limits Gait & Station: normal Patient leans: N/A  Psychiatric Specialty Exam: Physical Exam    Review of Systems  Psychiatric/Behavioral: Positive for depression, substance abuse and suicidal ideas (pt denies but high risk). Negative for hallucinations. The patient is nervous/anxious. The patient does not have insomnia.   All other systems reviewed and are negative.   Blood pressure (!) 103/57, pulse 100, temperature 97.8 F (36.6 C), temperature source Axillary, resp. rate 18, height 5' 6"  (1.676 m), weight 73.3 kg (161 lb 9.6 oz), last menstrual period 12/01/2015, SpO2 100 %.Body mass index is 26.08 kg/m.  General Appearance: Casual and Fairly Groomed  Eye Contact:  Good  Speech:  Clear and Coherent and Normal Rate  Volume:  Normal  Mood:  Anxious and Depressed  Affect:  Appropriate, Congruent and Depressed  Thought Process:  Coherent, Goal Directed, Linear and Descriptions of Associations: Intact  Orientation:  Full (Time, Place, and Person)  Thought Content:  Symptoms, worries, concerns  Suicidal Thoughts:  No  Homicidal Thoughts:  No  Memory:  Immediate;   Fair Recent;   Fair Remote;   Fair  Judgement:  Fair  Insight:  Fair  Psychomotor Activity:  Normal  Concentration:  Concentration: Fair and Attention Span: Fair  Recall:  [employer] of Knowledge:  Fair  Language:  Fair  Akathisia:  No  Handed:    AIMS (if indicated):     Assets:  Communication Skills Resilience Social Support Talents/Skills  ADL's:  Intact  Cognition:  WNL  Sleep:      Treatment Plan Summary: Polysubstance abuse with recent relapse of heroin MDD (major depressive disorder), recurrent severe, without psychosis ([place])  Patient clearly indicated she has no intention to kill herself and has no previous suicidal attempts.  Patient has no evidence of auditory/visual hallucinations, delusions or paranoia.  Patient has been improved from her encephalitis and altered mental status and no further seizure-like activity  but continued to have short-term memory deficits as observed during the  neurological evaluation which are probably secondary to hypoxic injury to the hippocampal area.  Based on my evaluation patient does not meet criteria for acute psychiatric hospitalization, patient is psychiatrically cleared for the substance abuse rehabilitation.  Patient will benefit from long-term rehab such as [facility] or [facility] [place] [place] for [facility]. Patient was previously treated at [facility] as she has been there in the past with good results.   Medications Lexapro 59m po daily for MDD/anxiety Buspar 7.516mpo bid for severe anxiety Vistaril 2595mo q6h prn breakthrough anxiety  Patient mother reported she has been working with the hospital social service Mr. BryMarshell Levangarding appropriate placement  Disposition: No evidence of imminent risk to self or others at present.   Patient does not meet criteria for psychiatric inpatient admission. Supportive therapy provided about ongoing stressors. Long-term rehabilitation for substance abuse and also required protected environment when discharged from the hospital  JANAmbrose FinlandD 01/25/2016 12:05 PM   Agree with NP note and assessment

## 2016-01-25 NOTE — Clinical Social Work Note (Addendum)
[facility]. Chrissie NoaWilliam states that [facility] will not accept the patient until she has completed followup with neurology outpatient. Once she has complete outpatient followup the patient will need to provide medical records to the facility to be admitted from home. CSW will updated family.    UPDATE: CSW updated MD and family at bedside. At this time we will wait for psychiatry to see the patient. If the patient is not cleared by psychiatry it may be beneficial for the patient to go to inpatient psych treatment and transition to a rehab such as [facility] once ready for treatment.    Roddie McBryant Shamariah Shewmake MSW, [place]LCSW, [place], [phone]

## 2016-01-25 NOTE — Progress Notes (Signed)
PROGRESS NOTE    Brandy Sherman  ZOX:[id number] DOB: [birth year] DOA: 01/18/2016 PCP: Hoyt Koch, MD   Brief Narrative:Pt is encephelopathic; therefore, this HPI is obtained from chart review. Brandy Sherman is a 25 y.o. female with PMH as outlined below. She presented to [facility] ED on 10/10 with heroin overdose. She responded well to Narcan and was observed for several hours. She remains stable therefore was discharged home.  On 10/11 she was found unresponsive by a friend in her car. EMS was summoned and upon their arrival, patient had questionable seizure-like activity. She was given 2 mg of Versed and patient then had decerebrate posturing. She was given an additional 2 mg Versed with resolution. Prior to this she had also been given Narcan with no response.  When she arrived [facility] ED, she had persistent AMS with GCS 3. She was subsequently intubated for airway protection. UDS was positive for benzos and cocaine. Patient was found to have multiple track marks throughout her body including left neck/EJ, bilateral upper extremities, bilateral lower extremities.  CTs of the head and neck were negative. CT of the chest only demonstrated right lower lobe opacity concerning for aspiration.    Assessment & Plan:   Principal Problem:   MDD (major depressive disorder), recurrent severe, without psychosis (HCC) Active Problems:   Overdose   Acute hypoxemic respiratory failure (HCC)   Cocaine abuse   Seizure (HCC)   Aspiration pneumonia (HCC)   Hypokalemia   Extrapyramidal syndrome   UTI (urinary tract infection)  1-Overdose, Cocaine, Benzos. Second episode of overdose. Prior overdose with heroin.  Depression  Started  lexapro 10-16 . Will start Buspar today.    Psych consulted. Patient will need  inpatient drug rehabilitation. SW consulted.  Continue with sitter.   [facility] decline patient due to memory problems. Also Awaiting for Psych evaluation to see if patient needs  to go to Inpatient psychiatric facility.   2-Acute encephalopathy - due to overdose from above.   Extensive restricted diffusion throughout the hippocampus ; likely related to drugs overdose fentanyl, heroin.  ? Seizure like activity on admission .  B 12; normal, TSH normal. Ammonia normal.  on tazadone PRN.  MRI brain; extensive restricted diffusion throughout the hippocampus.  EEG on admission negative for seizure. Repeated EEG 10-15 ;There were no epileptiform discharges or electrographic seizures seen.   MRI concerning with seizure ?, RN report an episode where the patient was drooling , no responsive, extending legs. Patient received ativan at that time. Neurology was consulted.  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 Probably was having partial seizure. Continue with Keppra. Patient will need repeated MRI follow up outpatient.   3-Respiratory Failure secondary to overdose. Required intubation during this admission. with inability to protect airway in setting of altered mental status due to overdose. S/P extubation 10-13. Respiratory status stable.   Aspiration PNA;  Received  Zosyn. Received for 4 days treatment.   UTI, E coli. Treated.  On zosyn.   Elevated troponin; in setting of cocaine use. Trending down. ECHO normal.   Hypomagnesemia; replete IV on 10-15 Repeat labs in am. .  Hypokalemia; replete with 40 meq.   Diarrhea, resolved.    Mild transaminases;  Normalized.  Screening for HIV non reactive.   DVT prophylaxis: heparin  Code Status: full code.  Family Communication: Mother at bedside,  Disposition Plan: medical clear, awaiting rehab and safety discharge plan.   Consultants:   CCM admitted patient.  Psych    Procedures:   Intubation on admission, Extubation 10-13.     Antimicrobials:  Zosyn 10-12   Subjective: She is alert, continue to have short term memory problems.  No  diarrhea.  Objective: Vitals:   01/24/16 0700 01/24/16 1358 01/24/16 2117 01/25/16 0554  BP:  127/79 (!) 108/46 (!) 103/57  Pulse:  (!) 104 93 100  Resp:  18 18 18   Temp:  97.4 F (36.3 C) 97.4 F (36.3 C) 97.8 F (36.6 C)  TempSrc:  Oral Oral Axillary  SpO2:  99% 100% 100%  Weight: 73.3 kg (161 lb 9.6 oz)     Height:        Intake/Output Summary (Last 24 hours) at 01/25/16 1432 Last data filed at 01/25/16 1100  Gross per 24 hour  Intake             1080 ml  Output                0 ml  Net             1080 ml   Filed Weights   01/22/16 0700 01/23/16 0754 01/24/16 0700  Weight: 75.9 kg (167 lb 5.3 oz) 80.4 kg (177 lb 4 oz) 73.3 kg (161 lb 9.6 oz)    Examination:  General exam: Appears calm and comfortable  Respiratory system: Clear to auscultation. Respiratory effort normal. Cardiovascular system: S1 & S2 heard, RRR. No JVD, murmurs, rubs, gallops or clicks. No pedal edema. Gastrointestinal system: Abdomen is nondistended, soft and nontender. No organomegaly or masses felt. Normal bowel sounds heard. Central nervous system: Alert and oriented. No focal neurological deficits. Extremities: Symmetric 5 x 5 power. Skin: No rashes, lesions or ulcers     Data Reviewed: I have personally reviewed following labs and imaging studies  CBC:  Recent Labs Lab 01/18/16 2001 01/19/16 0602 01/21/16 0802 01/22/16 0523 01/23/16 0349  WBC 11.4* 8.7 5.2 3.9* 4.8  NEUTROABS 10.4*  --   --   --   --   HGB 11.6* 10.1* 11.0* 11.1* 11.2*  HCT 35.0* 30.7* 32.8* 33.1* 33.8*  MCV 87.9 86.2 84.5 84.4 84.5  PLT 228 231 208 212 257   Basic Metabolic Panel:  Recent Labs Lab 01/19/16 0602 01/21/16 0802 01/22/16 0523 01/23/16 0349 01/24/16 0841 01/25/16 0458  NA 138 141 139 139 139 137  K 3.2* 2.8* 3.8 3.1* 3.9 3.6  CL 107 108 111 111 109 104  CO2 23 24 20* 21* 24 25  GLUCOSE 79 75 88 139* 87 86  BUN 7 <5* <5* <5* <5* <5*  CREATININE 0.89 0.76 0.71 0.70 0.75 0.68  CALCIUM  7.7* 7.9* 8.3* 8.7* 9.1 9.1  MG 1.7 1.6* 1.8  --   --  1.8  PHOS 1.6* 2.5  --   --   --   --    GFR: Estimated Creatinine Clearance: 113 mL/min (by C-G formula based on SCr of 0.68 mg/dL). Liver Function Tests:  Recent Labs Lab 01/18/16 2001 01/22/16 0523  AST 64* 38  ALT 73* 47  ALKPHOS 62 32*  BILITOT 0.5 0.4  PROT 6.7 6.4*  ALBUMIN 3.3* 2.8*   No results for input(s): LIPASE, AMYLASE in the last 168 hours.  Recent Labs Lab 01/22/16 0523  AMMONIA 29   Coagulation Profile: No results for input(s): INR, PROTIME in the last 168 hours. Cardiac Enzymes:  Recent Labs Lab 01/19/16 0200 01/19/16 0602 01/19/16 1235  TROPONINI 0.88* 0.85* 0.48*   BNP (  last 3 results) No results for input(s): PROBNP in the last 8760 hours. HbA1C: No results for input(s): HGBA1C in the last 72 hours. CBG:  Recent Labs Lab 01/20/16 0805 01/20/16 1217 01/20/16 1559 01/20/16 2030 01/21/16 1901  GLUCAP 75 71 74 86 199*   Lipid Profile: No results for input(s): CHOL, HDL, LDLCALC, TRIG, CHOLHDL, LDLDIRECT in the last 72 hours. Thyroid Function Tests: No results for input(s): TSH, T4TOTAL, FREET4, T3FREE, THYROIDAB in the last 72 hours. Anemia Panel: No results for input(s): VITAMINB12, FOLATE, FERRITIN, TIBC, IRON, RETICCTPCT in the last 72 hours. Sepsis Labs:  Recent Labs Lab 01/19/16 0200  PROCALCITON 1.95  LATICACIDVEN 1.1    Recent Results (from the past 240 hour(s))  MRSA PCR Screening     Status: Abnormal   Collection Time: 01/19/16  1:51 AM  Result Value Ref Range Status   MRSA by PCR POSITIVE (A) NEGATIVE Final    Comment:        The GeneXpert MRSA Assay (FDA approved for NASAL specimens only), is one component of a comprehensive MRSA colonization surveillance program. It is not intended to diagnose MRSA infection nor to guide or monitor treatment for MRSA infections. RESULT CALLED TO, READ BACK BY AND VERIFIED WITH: Sheliah Plane AT 1610 01/19/16 BY M KELLY    Culture, blood (routine x 2)     Status: None   Collection Time: 01/19/16  2:32 AM  Result Value Ref Range Status   Specimen Description BLOOD LEFT THUMB  Final   Special Requests IN PEDIATRIC BOTTLE  Final   Culture NO GROWTH 5 DAYS  Final   Report Status 01/24/2016 FINAL  Final  Culture, Urine     Status: Abnormal   Collection Time: 01/19/16  2:40 AM  Result Value Ref Range Status   Specimen Description URINE, CATHETERIZED  Final   Special Requests NONE  Final   Culture >=100,000 COLONIES/mL ESCHERICHIA COLI (A)  Final   Report Status 01/21/2016 FINAL  Final   Organism ID, Bacteria ESCHERICHIA COLI (A)  Final      Susceptibility   Escherichia coli - MIC*    AMPICILLIN >=32 RESISTANT Resistant     CEFAZOLIN <=4 SENSITIVE Sensitive     CEFTRIAXONE <=1 SENSITIVE Sensitive     CIPROFLOXACIN <=0.25 SENSITIVE Sensitive     GENTAMICIN >=16 RESISTANT Resistant     IMIPENEM <=0.25 SENSITIVE Sensitive     NITROFURANTOIN <=16 SENSITIVE Sensitive     TRIMETH/SULFA >=320 RESISTANT Resistant     AMPICILLIN/SULBACTAM 16 INTERMEDIATE Intermediate     PIP/TAZO <=4 SENSITIVE Sensitive     Extended ESBL NEGATIVE Sensitive     * >=100,000 COLONIES/mL ESCHERICHIA COLI  Culture, blood (routine x 2)     Status: None   Collection Time: 01/19/16  2:42 AM  Result Value Ref Range Status   Specimen Description BLOOD RIGHT HAND  Final   Special Requests AEROBIC BOTTLE ONLY  Final   Culture NO GROWTH 5 DAYS  Final   Report Status 01/24/2016 FINAL  Final         Radiology Studies: No results found.      Scheduled Meds: . busPIRone  7.5 mg Oral BID  . enoxaparin (LOVENOX) injection  40 mg Subcutaneous Q24H  . escitalopram  10 mg Oral QHS  . feeding supplement (ENSURE ENLIVE)  237 mL Oral BID BM  . fluticasone  1 spray Each Nare Daily  . levETIRAcetam  500 mg Oral BID  . saccharomyces boulardii  250 mg Oral BID   Continuous Infusions:     LOS: 7 days    Time spent: 35  minutes.     Alba Coryegalado, Makenah Karas A, MD [facility] Pager [phone]  If 7PM-7AM, please contact night-coverage [email or web address] Password [email or web address] 01/25/2016, 2:32 PM

## 2016-01-25 NOTE — Clinical Social Work Note (Signed)
Clinical Social Work Assessment  Patient Details  Name: Brandy Sherman MRN: [id number] Date of Birth: 1990/05/01  Date of referral:  01/25/16               Reason for consult:  Discharge Planning, Substance Use/ETOH Abuse                Permission sought to share information with:  Facility Sport and exercise psychologist, Family Supports Permission granted to share information::  Yes, Verbal Permission Granted  Name::     Interior and spatial designer::  [employer]  Relationship::  Mom and Dad  Contact Information:     Housing/Transportation Living arrangements for the past 2 months:  [place] of Information:  Patient, Parent Patient Interpreter Needed:  None Criminal Activity/Legal Involvement Pertinent to Current Situation/Hospitalization:  No - Comment as needed Significant Relationships:  Parents Lives with:  Parents Do you feel safe going back to the place where you live?  Yes Need for family participation in patient care:  No (Coment)  Care giving concerns:  Patient and mom are concerned about the patient returning home at this time due to high likelihood of relapse. Both are requesting assistance with placement at [facility].   Social Worker assessment / plan:  CSW met with patient at bedside to complete assessment. Patient and mom both present, patient requested that mom be present during conversation. The patient has been admitted for drug overdose. The patient shares that she received treatment at [facility] back in May and was discharged to a halfway house which she was kicked out of due to a positive drug test. The patient has been admitted for a second overdose. At this time the patient and mom are requesting admission to [facility] for drug treatment. CSW has made referral. The patient has been seen by psychiatry and their initial recommendation was for inpatient psych admission. CSW assessed the patient for suicidal ideation, plans, means, and intent, but  the patient denies these. She states that she just wants to get into treatment. CSW will continue to follow this patient to assist with process of getting admitted to [facility] if cleared by psychiatry.   Employment status:    Insurance information:  Managed Care PT Recommendations:  Not assessed at this time Information / Referral to community resources:  Other (Comment Required), SBIRT (Referral made to [facility])  Patient/Family's Response to care:  The patient's family is happy with the care the patient has received. The patient is anxious to get out of the hospital.   Patient/Family's Understanding of and Emotional Response to Diagnosis, Current Treatment, and Prognosis:  The patient and family have a good understanding of the reason for the patient's admission.   Emotional Assessment Appearance:  Appears stated age Attitude/Demeanor/Rapport:  Other (Patient is appropriate and welcoming of CSW.) Affect (typically observed):  Accepting, Appropriate, Calm, Pleasant Orientation:  Oriented to Self, Oriented to Place, Oriented to  Time, Oriented to Situation Alcohol / Substance use:  Illicit Drugs (Cocaine and heroin use.) Psych involvement (Current and /or in the community):  No (Comment)  Discharge Needs  Concerns to be addressed:  Substance Abuse Concerns Readmission within the last 30 days:  No Current discharge risk:  Substance Abuse Barriers to Discharge:  Continued Medical Work up   Rigoberto Noel, LCSW 01/25/2016, 8:38 AM

## 2016-01-26 ENCOUNTER — Ambulatory Visit: Payer: PRIVATE HEALTH INSURANCE | Admitting: Obstetrics and Gynecology

## 2016-01-26 DIAGNOSIS — F1410 Cocaine abuse, uncomplicated: Secondary | ICD-10-CM

## 2016-01-26 DIAGNOSIS — J9601 Acute respiratory failure with hypoxia: Secondary | ICD-10-CM

## 2016-01-26 MED ORDER — BUSPIRONE HCL 7.5 MG PO TABS: 7.5000 mg | ORAL_TABLET | Freq: Two times a day (BID) | ORAL | 0 refills | Status: AC

## 2016-01-26 MED ORDER — LEVETIRACETAM 500 MG PO TABS: 500.0000 mg | ORAL_TABLET | Freq: Two times a day (BID) | ORAL | 0 refills | Status: DC

## 2016-01-26 MED ORDER — ESCITALOPRAM OXALATE 10 MG PO TABS: 10.0000 mg | ORAL_TABLET | Freq: Every day | ORAL | 0 refills | Status: AC

## 2016-01-26 NOTE — Clinical Social Work Note (Signed)
CSW received call from patient's dad regarding responses from treatment facilities. Dad Wallace CullensGray states that they plan to bring the patient home at time of discharge and complete the neurology followup outpatient. CSW explained that once this is complete, the family can have the necessary medical records sent to [facility] to do an admission from home. Patient's dad Wallace CullensGray stated that he was very appreciative of the information and our assistance with trying to get the patient into a treatment program.   Roddie McBryant Emillie Chasen MSW, [place]ely[place]CSW, [place], [phone]

## 2016-01-26 NOTE — Discharge Summary (Addendum)
Physician Discharge Summary  Brandy Sherman ONG:[id number] DOB: 17-Apr-1990 DOA: 01/18/2016  PCP: Hoyt Koch, MD  Admit date: 01/18/2016 Discharge date: 01/26/2016  Recommendations for Outpatient Follow-up:  Continue Buspar and lexapro as prescribed Follow up in outpatient rehab as recommended byu psychiatry Continue Keppra for seizures   Discharge Diagnoses:  Principal Problem:   MDD (major depressive disorder), recurrent severe, without psychosis (HCC) Active Problems:   Overdose   Acute hypoxemic respiratory failure (HCC)   Cocaine abuse   Seizure (HCC)   Aspiration pneumonia (HCC)   Hypokalemia   Extrapyramidal syndrome   UTI (urinary tract infection)   Short-term memory loss   Opioid overdose    Discharge Condition: stable   Diet recommendation: as tolerated   History of present illness:   Per brief narrative 01/25/2016  "25 y.o.femalewith PMH as outlined below. She presented to [facility] ED on 10/10 with heroin overdose. She responded well to Narcan and was observed for several hours. She remains stable therefore was discharged home.  On 10/11 she was found unresponsive by a friend in her car. EMS was summoned and upon their arrival, patient had questionable seizure-like activity. She was given 2 mg of Versed and patient then had decerebrate posturing. She was given an additional 2 mg Versed with resolution. Prior to this she had also been given Narcan with no response.  When she arrived [facility] ED, she had persistent AMS with GCS 3. She was subsequently intubated for airway protection. UDS was positive for benzos and cocaine. Patient was found to have multiple track marks throughout her body including left neck/EJ, bilateral upper extremities, bilateral lower extremities.  CTs of the head and neck were negative. CT of the chest only demonstrated right lower lobe opacity concerning for aspiration."  Hospital Course:  Overdose, Cocaine, Benzos / Depression  -  Second episode of overdose. Prior overdose with heroin.  - Per psych, cleared for outpt rehab for drug abuse - Continue lexapro and Buspar on discharge    Acute encephalopathy - due to overdose - Extensive restricted diffusion throughout the hippocampus ; likely related to drugs overdose fentanyl, heroin.  - ? Seizure like activity on admission . - B 12; normal, TSH normal. Ammonia normal.  - MRI brain; extensive restricted diffusion throughout the hippocampus.  - EEG on admission negative for seizure. Repeated EEG 10-15 ;There were no epileptiform discharges or electrographic seizures seen.  - MRI concerning with seizure ?, RN report an episode where the patient was drooling , no responsive, extending legs. Patient received ativan at that time. Neurology was consulted. 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 Probably was having partial seizure. Continue with Keppra.   Respiratory Failure secondary to overdose - Required intubation during this admission. with inability to protect airway in setting of altered mental status due to overdose. - S/P extubation 10-13. - Respiratory status stable.   Aspiration PNA - Received  Zosyn. Received 4 days treatment.   UTI, E coli.  - Treated with zosyn   Elevated troponin - In setting of cocaine use. Trended down. ECHO normal.   Hypomagnesemia - Supplemented   Hypokalemia - Supplemented with 40 meq.   Diarrhea, resolved.    Mild transaminases - Normalized.   Screening for HIV non reactive.     DVT prophylaxis: heparin  Code Status: full code.  Family Communication: No family at bedside,    Consultants:   CCM admitted patient.   Psych   Procedures:  Intubation on admission, Extubation 10-13.  Antimicrobials:  Zosyn 10-12   Signed:  Manson Passey, MD  [facility] 01/26/2016, 9:32 AM  Pager #: [phone]  Time spent in minutes: less than 30  minutes    Discharge Exam: Vitals:   01/26/16 0503 01/26/16 0900  BP: (!) 81/70 96/63  Pulse: (!) 58 76  Resp: 18 18  Temp: 98.6 F (37 C)    Vitals:   01/25/16 1300 01/25/16 2136 01/26/16 0503 01/26/16 0900  BP:  110/72 (!) 81/70 96/63  Pulse:  93 (!) 58 76  Resp:  19 18 18   Temp:  98.6 F (37 C) 98.6 F (37 C)   TempSrc:  Oral Oral   SpO2:  100% 100% 99%  Weight: 73.3 kg (161 lb 9.6 oz)     Height:        General: Pt is alert, follows commands appropriately, not in acute distress Cardiovascular: Regular rate and rhythm, S1/S2 +, no murmurs Respiratory: Clear to auscultation bilaterally, no wheezing, no crackles, no rhonchi Abdominal: Soft, non tender, non distended, bowel sounds +, no guarding Extremities: no edema, no cyanosis, pulses palpable bilaterally DP and PT Neuro: Grossly nonfocal  Discharge Instructions  Discharge Instructions    Call MD for:  difficulty breathing, headache or visual disturbances    Complete by:  As directed    Call MD for:  persistant nausea and vomiting    Complete by:  As directed    Call MD for:  redness, tenderness, or signs of infection (pain, swelling, redness, odor or green/yellow discharge around incision site)    Complete by:  As directed    Call MD for:  severe uncontrolled pain    Complete by:  As directed    Diet - low sodium heart healthy    Complete by:  As directed    Discharge instructions    Complete by:  As directed    Continue Buspar and lexapro as prescribed Follow up in outpatient rehab as recommended byu psychiatry   Increase activity slowly    Complete by:  As directed        Medication List    STOP taking these medications   citalopram 20 MG tablet Commonly known as:  CELEXA   gabapentin 100 MG capsule Commonly known as:  NEURONTIN   naltrexone 50 MG tablet Commonly known as:  DEPADE   naproxen sodium 550 MG tablet Commonly known as:  ANAPROX DS   valACYclovir 500 MG tablet Commonly known as:   VALTREX     TAKE these medications   busPIRone 7.5 MG tablet Commonly known as:  BUSPAR Take 1 tablet (7.5 mg total) by mouth 2 (two) times daily.   escitalopram 10 MG tablet Commonly known as:  LEXAPRO Take 1 tablet (10 mg total) by mouth at bedtime.   KYLEENA 19.5 MG Iud Generic drug:  Levonorgestrel 1 each by Intrauterine route continuous.   levETIRAcetam 500 MG tablet Commonly known as:  KEPPRA Take 1 tablet (500 mg total) by mouth 2 (two) times daily.   traZODone 100 MG tablet Commonly known as:  DESYREL Take 100 mg by mouth at bedtime.       Follow-up Information    Anson Fret, MD Follow up in 2 week(s).   Specialty:  Neurology Contact information: [address] [place] [address] [phone]        Hoyt Koch, MD. Schedule an appointment as soon as possible for a visit in 1 week(s).  Specialty:  Family Medicine Contact information: [address] [place] [address] [place] [address] [phone]            The results of significant diagnostics from this hospitalization (including imaging, microbiology, ancillary and laboratory) are listed below for reference.    Significant Diagnostic Studies: Ct Head Wo Contrast  Result Date: 01/18/2016 CLINICAL DATA:  25 year old with heroin overdose. Intubated patient. EXAM: CT HEAD WITHOUT CONTRAST CT CERVICAL SPINE WITHOUT CONTRAST TECHNIQUE: Multidetector CT imaging of the head and cervical spine was performed following the standard protocol without intravenous contrast. Multiplanar CT image reconstructions of the cervical spine were also generated. COMPARISON:  None. FINDINGS: CT HEAD FINDINGS Brain: No evidence for acute hemorrhage, mass lesion, midline shift, hydrocephalus or large infarct. Vascular: No hyperdense vessel or unexpected calcification. Skull: Normal. Negative for fracture or focal lesion. Sinuses/Orbits: Polyp in the right maxillary sinus. The other paranasal sinuses are clear.  Other: None CT CERVICAL SPINE FINDINGS Alignment: Normal. Skull base and vertebrae: No acute fracture. No primary bone lesion or focal pathologic process. Soft tissues and spinal canal: Small amount of fluid in the nasopharynx and oropharynx. Endotracheal tube is present. No significant paravertebral soft tissue swelling. Disc levels:  Disc spaces are maintained. Upper chest: Negative. Other: None IMPRESSION: No acute intracranial abnormality. No acute abnormality in cervical spine. Intubated patient. Small amount of fluid in the nasopharynx and oropharynx. Electronically Signed   By: Richarda Overlie M.D.   On: 01/18/2016 21:46   Ct Chest W Contrast  Result Date: 01/18/2016 CLINICAL DATA:  Acute onset of vomiting. Heroin overdose. Seizure after Narcan administration. Initial encounter. EXAM: CT CHEST WITH CONTRAST TECHNIQUE: Multidetector CT imaging of the chest was performed during intravenous contrast administration. CONTRAST:  75mL ISOVUE-300 IOPAMIDOL (ISOVUE-300) INJECTION 61% COMPARISON:  None. FINDINGS: Cardiovascular: The heart is unremarkable in appearance. The thoracic aorta is grossly unremarkable. No calcific atherosclerotic disease is seen. The great vessels are unremarkable in appearance. Mediastinum/Nodes: The mediastinum is unremarkable in appearance. No mediastinal lymphadenopathy is seen. No pericardial effusion is identified. The patient's endotracheal tube is seen ending 2-3 cm above the carina. The visualized portions of the thyroid gland are unremarkable. No axillary lymphadenopathy is seen. Lungs/Pleura: Patchy bilateral lower lobe airspace opacities are noted, right greater than left. This may reflect aspiration pneumonia, given clinical concern. No pleural effusion or pneumothorax is seen. No masses are identified. Upper Abdomen: The visualized portions of the liver and spleen are grossly unremarkable. A large stone is noted within the gallbladder. The visualized portions of the gallbladder  are otherwise unremarkable. The visualized portions of the pancreas, adrenal glands and kidneys are within normal limits. Musculoskeletal: No acute osseous abnormalities are seen. The visualized musculature is grossly unremarkable. IMPRESSION: 1. Patchy bilateral lower lobe airspace opacities, right greater than left. This is suspicious for aspiration pneumonia, given clinical concern. 2. Cholelithiasis.  Gallbladder otherwise unremarkable. Electronically Signed   By: Roanna Raider M.D.   On: 01/18/2016 21:47   Ct Cervical Spine Wo Contrast  Result Date: 01/18/2016 CLINICAL DATA:  25 year old with heroin overdose. Intubated patient. EXAM: CT HEAD WITHOUT CONTRAST CT CERVICAL SPINE WITHOUT CONTRAST TECHNIQUE: Multidetector CT imaging of the head and cervical spine was performed following the standard protocol without intravenous contrast. Multiplanar CT image reconstructions of the cervical spine were also generated. COMPARISON:  None. FINDINGS: CT HEAD FINDINGS Brain: No evidence for acute hemorrhage, mass lesion, midline shift, hydrocephalus or large infarct. Vascular: No hyperdense vessel or unexpected calcification. Skull: Normal. Negative  for fracture or focal lesion. Sinuses/Orbits: Polyp in the right maxillary sinus. The other paranasal sinuses are clear. Other: None CT CERVICAL SPINE FINDINGS Alignment: Normal. Skull base and vertebrae: No acute fracture. No primary bone lesion or focal pathologic process. Soft tissues and spinal canal: Small amount of fluid in the nasopharynx and oropharynx. Endotracheal tube is present. No significant paravertebral soft tissue swelling. Disc levels:  Disc spaces are maintained. Upper chest: Negative. Other: None IMPRESSION: No acute intracranial abnormality. No acute abnormality in cervical spine. Intubated patient. Small amount of fluid in the nasopharynx and oropharynx. Electronically Signed   By: Richarda Overlie M.D.   On: 01/18/2016 21:46   Mr Brain Wo  Contrast  Result Date: 01/22/2016 CLINICAL DATA:  Drug overdose with higher when. Seizure-like activity. EXAM: MRI HEAD WITHOUT CONTRAST TECHNIQUE: Multiplanar, multiecho pulse sequences of the brain and surrounding structures were obtained without intravenous contrast. COMPARISON:  CT head without contrast 01/18/2016 FINDINGS: Brain: Profound restricted diffusion is evident within the medial temporal lobes. Abnormal signal is present throughout the hippocampus. Increased T2 signal is noted as well. No other significant white matter changes are present in the brain. The brainstem and cerebellum are normal. No other acute infarct is present. The ventricles are of normal size. No significant extra-axial fluid collection is present. Vascular: Flow is present in the major intracranial arteries. Skull and upper cervical spine: The skullbase is within normal limits. Midline sagittal structures are otherwise unremarkable. Sinuses/Orbits: Polyps in mucosal thickening are noted along the floor of the maxillary sinuses bilaterally. The paranasal sinuses are otherwise clear. The globes and orbits are intact. IMPRESSION: 1. 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 This is compatible with seizures, potentially related to withdrawal. This may represent status epilepticus. Bilateral ischemic changes are considered much less likely. Electronically Signed   By: Marin Roberts M.D.   On: 01/22/2016 10:51   Dg Chest Port 1 View  Result Date: 01/19/2016 CLINICAL DATA:  Respiratory failure, overdose, intubated patient. EXAM: PORTABLE CHEST 1 VIEW COMPARISON:  Portable chest x-ray of January 17, 2010 2017. FINDINGS: The lungs are well-expanded. There is hazy increased density that projects over the lower left hemi thorax. The heart and pulmonary vascularity are normal. The endotracheal tube tip lies 3.9 cm above the carina. The esophagogastric tube tip  projects below the inferior margin of the image. The bony structures are unremarkable. IMPRESSION: Subtle increased density over the left lower hemi thorax may reflect atelectasis or early pneumonia. It is not greatly changed from the previous study. Stable examination otherwise. Electronically Signed   By: David  [place] M.D.   On: 01/19/2016 08:20   Dg Chest Portable 1 View  Result Date: 01/18/2016 CLINICAL DATA:  Check endotracheal tube placement, recent overdose EXAM: PORTABLE CHEST 1 VIEW COMPARISON:  CT from earlier in the same day FINDINGS: Endotracheal tube is again identified 3 cm above the carina. Cardiac shadow is within normal limits. The patchy changes in the posterior lungs bilaterally are again seen and stable. No sizable effusion is seen. No bony abnormality is noted. IMPRESSION: Bilateral patchy airspace disease similar to that seen on recent CT examination. Endotracheal tube in satisfactory position. Electronically Signed   By: Alcide Clever M.D.   On: 01/18/2016 22:02   Dg Abd Portable 1v  Result Date: 01/19/2016 CLINICAL DATA:  OG tube placement. EXAM: PORTABLE ABDOMEN - 1 VIEW COMPARISON:  None. FINDINGS: Tip and side port of the enteric tube below the diaphragm  in the stomach. No bowel dilatation to suggest obstruction. Moderate stool burden. Excreted intravenous contrast within the renal collecting systems from prior CT. IMPRESSION: Tip and side port of the enteric tube below the diaphragm in the stomach. Electronically Signed   By: Rubye OaksMelanie  Ehinger M.D.   On: 01/19/2016 03:10    Microbiology: Recent Results (from the past 240 hour(s))  MRSA PCR Screening     Status: Abnormal   Collection Time: 01/19/16  1:51 AM  Result Value Ref Range Status   MRSA by PCR POSITIVE (A) NEGATIVE Final    Comment:        The GeneXpert MRSA Assay (FDA approved for NASAL specimens only), is one component of a comprehensive MRSA colonization surveillance program. It is not intended to  diagnose MRSA infection nor to guide or monitor treatment for MRSA infections. RESULT CALLED TO, READ BACK BY AND VERIFIED WITH: J JONES,RN AT [id number] 01/19/16 BY M KELLY   Culture, blood (routine x 2)     Status: None   Collection Time: 01/19/16  2:32 AM  Result Value Ref Range Status   Specimen Description BLOOD LEFT THUMB  Final   Special Requests IN PEDIATRIC BOTTLE 3ML  Final   Culture NO GROWTH 5 DAYS  Final   Report Status 01/24/2016 FINAL  Final  Culture, Urine     Status: Abnormal   Collection Time: 01/19/16  2:40 AM  Result Value Ref Range Status   Specimen Description URINE, CATHETERIZED  Final   Special Requests NONE  Final   Culture >=100,000 COLONIES/mL ESCHERICHIA COLI (A)  Final   Report Status 01/21/2016 FINAL  Final   Organism ID, Bacteria ESCHERICHIA COLI (A)  Final      Susceptibility   Escherichia coli - MIC*    AMPICILLIN >=32 RESISTANT Resistant     CEFAZOLIN <=4 SENSITIVE Sensitive     CEFTRIAXONE <=1 SENSITIVE Sensitive     CIPROFLOXACIN <=0.25 SENSITIVE Sensitive     GENTAMICIN >=16 RESISTANT Resistant     IMIPENEM <=0.25 SENSITIVE Sensitive     NITROFURANTOIN <=16 SENSITIVE Sensitive     TRIMETH/SULFA >=320 RESISTANT Resistant     AMPICILLIN/SULBACTAM 16 INTERMEDIATE Intermediate     PIP/TAZO <=4 SENSITIVE Sensitive     Extended ESBL NEGATIVE Sensitive     * >=100,000 COLONIES/mL ESCHERICHIA COLI  Culture, blood (routine x 2)     Status: None   Collection Time: 01/19/16  2:42 AM  Result Value Ref Range Status   Specimen Description BLOOD RIGHT HAND  Final   Special Requests AEROBIC BOTTLE ONLY 6ML  Final   Culture NO GROWTH 5 DAYS  Final   Report Status 01/24/2016 FINAL  Final     Labs: Basic Metabolic Panel:  Recent Labs Lab 01/21/16 0802 01/22/16 0523 01/23/16 0349 01/24/16 0841 01/25/16 0458  NA 141 139 139 139 137  K 2.8* 3.8 3.1* 3.9 3.6  CL 108 111 111 109 104  CO2 24 20* 21* 24 25  GLUCOSE 75 88 139* 87 86  BUN <5* <5* <5* <5*  <5*  CREATININE 0.76 0.71 0.70 0.75 0.68  CALCIUM 7.9* 8.3* 8.7* 9.1 9.1  MG 1.6* 1.8  --   --  1.8  PHOS 2.5  --   --   --   --    Liver Function Tests:  Recent Labs Lab 01/22/16 0523  AST 38  ALT 47  ALKPHOS 32*  BILITOT 0.4  PROT 6.4*  ALBUMIN 2.8*   No results for  input(s): LIPASE, AMYLASE in the last 168 hours.  Recent Labs Lab 01/22/16 0523  AMMONIA 29   CBC:  Recent Labs Lab 01/21/16 0802 01/22/16 0523 01/23/16 0349  WBC 5.2 3.9* 4.8  HGB 11.0* 11.1* 11.2*  HCT 32.8* 33.1* 33.8*  MCV 84.5 84.4 84.5  PLT 208 212 257   Cardiac Enzymes:  Recent Labs Lab 01/19/16 1235  TROPONINI 0.48*   BNP: BNP (last 3 results) No results for input(s): BNP in the last 8760 hours.  ProBNP (last 3 results) No results for input(s): PROBNP in the last 8760 hours.  CBG:  Recent Labs Lab 01/20/16 0805 01/20/16 1217 01/20/16 1559 01/20/16 2030 01/21/16 1901  GLUCAP 75 71 74 86 199*

## 2016-01-26 NOTE — Progress Notes (Signed)
Dr Elisabeth Pigeonevine in to see pt at this time, [facility] and verbally told RN and suicide sitter at bedside that she was discontinuing the suiicide precautions/sitter--no longer needed per psych eval.   All orders d/c'd pertaining to suicide sitter.

## 2016-01-26 NOTE — Progress Notes (Signed)
D/c instructions reviewed with pt and her father, scripts and copy of instructions given to pt. Pt to f/u with medical MD and neurology--father has already called and made an appt for October 31 he states to see neurology MD.  Pt d/c'd via wheelchair with belongings, with family, escorted by hospital volunteer.   Had discussion on drug abuse rehab with patient and the importance of doing so for her health. Pt agreed and states she plans to do so.

## 2016-01-26 NOTE — Clinical Social Work Note (Signed)
CSW notes that the patient has been cleared by psych. CS[facility] this [facility] [place], they report that they are also unable to accept the patient until her neurology f/u is complete. Covering CSW notified of this information. We [facility] not heard [facility] yet. Covering CSW will followup with [facility] and update patient and family.    Roddie McBryant Rim Thatch MSW, [place]CS[place], [place], [phone]

## 2016-01-30 ENCOUNTER — Telehealth: Payer: Self-pay | Admitting: Obstetrics and Gynecology

## 2016-01-30 ENCOUNTER — Ambulatory Visit: Payer: PRIVATE HEALTH INSURANCE | Admitting: Obstetrics and Gynecology

## 2016-01-30 NOTE — Telephone Encounter (Signed)
While working on patients Hep C referral, I discovered Ms Arvilla MarketMills [facility] until discharged on 01/26/16. She is awaiting a neurology clearance on 02/07/16 to enter an inpatient rehabilitation center and is currently home with her parents. I placed a call to her mother this morning to review this referral with her and give her the information regarding their current scheduling. Her mother, Jorja LoaDenise Hash, is listed on her DPR signed 11/2015. She verified the patients identity by providing her date of birth. I reviewed the referral information. Understood and agreeable.   Ms Edwena BundeHash stated she cancelled an upcoming appointment for the patients IUD check up. She asked if this needed to be rescheduled soon based on the current situation. I noted I would send a message to clinical to advise and to call if Ms Arvilla MarketMills experiences any pain, cramping or bleeding. She notes no issues at this time.  Routing to clinical to follow up with additional instructions.

## 2016-01-31 ENCOUNTER — Telehealth: Payer: Self-pay | Admitting: *Deleted

## 2016-01-31 NOTE — Telephone Encounter (Signed)
Spoke with patient's mother,Denise, okay per ROI. Advised of message as seen below from Dr.Jertson. Mother is agreeable. IUD recheck scheduled for 12/14/201 7 at 3 pm. Mother is agreeable to date and time.   Routing to provider for final review. Patient agreeable to disposition. Will close encounter.

## 2016-01-31 NOTE — Telephone Encounter (Signed)
Patient's mother called stating patient is currently in a rehab program. Spoke to Dr. Luciana Axeomer and this referral will be put on hold until discharge. At that time patient will need to be in an outpatient program to proceed with Hep C treatment.  Brandy Sherman

## 2016-01-31 NOTE — Telephone Encounter (Signed)
Routing to Dr.Jertson for review and advise. 

## 2016-01-31 NOTE — Telephone Encounter (Signed)
Please have her set up an IUD check in a month or two, when she should be out of rehabilitation.

## 2016-02-07 ENCOUNTER — Encounter: Payer: Self-pay | Admitting: Neurology

## 2016-02-07 ENCOUNTER — Ambulatory Visit (INDEPENDENT_AMBULATORY_CARE_PROVIDER_SITE_OTHER): Payer: PRIVATE HEALTH INSURANCE | Admitting: Neurology

## 2016-02-07 VITALS — BP 133/73 | HR 92 | Ht 66.0 in | Wt 168.2 lb

## 2016-02-07 DIAGNOSIS — R413 Other amnesia: Secondary | ICD-10-CM | POA: Diagnosis not present

## 2016-02-07 DIAGNOSIS — R569 Unspecified convulsions: Secondary | ICD-10-CM | POA: Diagnosis not present

## 2016-02-07 DIAGNOSIS — T50901D Poisoning by unspecified drugs, medicaments and biological substances, accidental (unintentional), subsequent encounter: Secondary | ICD-10-CM

## 2016-02-07 MED ORDER — LEVETIRACETAM 500 MG PO TABS: 500.0000 mg | ORAL_TABLET | Freq: Two times a day (BID) | ORAL | 4 refills | Status: DC

## 2016-02-07 NOTE — Progress Notes (Signed)
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 female with a history of drug abuse who presented with unresponsiveness that was presumed to be due to drug overdose. She was in the ED for some time and responded well to Narcan. She told her family that she used heroin mixed with cocaine, possibly laced with fentanyl. Several days later she was admitted again presumed drug overdose. She was found unresponsive in a car. She had possible seizure activity en route, and was given Versed by EMS. On arrival to [facility] she had a GCS of 3 UDS was positive for benzos and cocaine. She was intubated and an EEG was performed on 10/12 which just showed changes typical of propofol sedation. Patient was treated with Keppra without recurrent seizures. MRI of the brain showed hippocampal diffusion changes and she had difficulty with new memories, she was discharged in stable and improving condition.  She has greatly improved since discharge, and is no longer encephalopathic with the exception of the fact that she has some short-term memory mld complaints. Shs is here with her mother and appears appropriate. Her memory is improving. She is living with her memory. She still has some difficulty with memory but with prompting she remembers. She performs all her own ADLs independently, she is paying her own bills, she is not driving due to seizures, she is using her cell phone, she can remember being in the hospital, some memory with current issues. She appears to be appropriate, remembers the hospital.She can rememebr things from earlier in the day.   MRI of the brain 01/22/2016: IMPRESSION: 1. 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 This is compatible with seizures,  potentially related to withdrawal. This may represent status epilepticus. Bilateral ischemic changes are considered much less likely.  Review of Systems: Patient complains of symptoms per HPI as well as the following symptoms: Feeling cold, memory loss, confusion, depression, anxiety. Pertinent negatives per HPI. All others negative.   Social History   Social History  . Marital status: Single    Spouse name: N/A  . Number of children: 11  . Years of education: 0   Occupational History  . unemployed    Social History Main Topics  . Smoking status: Never Smoker  . Smokeless tobacco: Never Used  . Alcohol use No  . Drug use:     Types: Cocaine     Comment: was clean for over 200 days prior to [facility] 2017; heroin, crack, cocaine use  . Sexual activity: Yes    Partners: Male    Birth control/ protection: IUD   Other Topics Concern  . Not on file   Social History Narrative   Lives w/ parents   Caffeine use: 2cups tea per day   Soda- 2 per day    Family History  Problem Relation Age of Onset  . Melanoma Mother   . Hypertension Father   . Hyperlipidemia Father   . Diabetes Maternal Grandmother   . Diabetes Maternal Grandfather     Past Medical History:  Diagnosis Date  . Anxiety   . Clotting disorder (HCC)   . Hepatitis C virus   . HSV-2 (herpes simplex virus 2) infection   . STD (sexually transmitted disease)    HSV II  . 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 arm surgery Right   . WRIST SURGERY Left    abcess     Current Outpatient Prescriptions  Medication Sig Dispense Refill  . busPIRone (BUSPAR) 7.5 MG tablet Take 1 tablet (7.5 mg total) by mouth 2 (two) times daily. 60 tablet 0  . escitalopram (LEXAPRO) 10 MG tablet Take 1 tablet (10 mg total) by mouth at bedtime. 30 tablet 0  . levETIRAcetam (KEPPRA) 500 MG tablet Take 1 tablet (500 mg total) by mouth 2  (two) times daily. 60 tablet 4  . Levonorgestrel (KYLEENA) 19.5 MG IUD 1 each by Intrauterine route continuous.    . traZODone (DESYREL) 100 MG tablet Take 100 mg by mouth at bedtime.  0   No current facility-administered medications for this visit.     Allergies as of 02/07/2016 - Review Complete 01/23/2016  Allergen Reaction Noted  . Haldol [haloperidol lactate] Other (See Comments) 01/22/2016    Vitals: BP 133/73 (BP Location: Right Arm, Patient Position: Sitting, Cuff Size: Normal)   Pulse 92   Ht 5\' 6"  (1.676 m)   Wt 168 lb 3.2 oz (76.3 kg)   LMP 12/01/2015 Comment: i shielded patient  BMI 27.15 kg/m  Last Weight:  Wt Readings from Last 1 Encounters:  02/07/16 168 lb 3.2 oz (76.3 kg)   Last Height:   Ht Readings from Last 1 Encounters:  02/07/16 5\' 6"  (1.676 m)   Physical exam: Exam: Gen: NAD, conversant, well nourised, obese, well groomed                     CV: RRR, no MRG. No Carotid Bruits. No peripheral edema, warm, nontender Eyes: Conjunctivae clear without exudates or hemorrhage  Neuro: Detailed Neurologic Exam  Speech:    Speech is normal; fluent and spontaneous with normal comprehension.  Cognition:    The patient is oriented to person, place, and time;  MMSE - Mini Mental State Exam 02/07/2016  Orientation to time 5  Orientation to Place 5  Registration 3  Attention/ Calculation 5  Recall 1  Language- name 2 objects 2  Language- repeat 1  Language- follow 3 step command 2  Language- read & follow direction 1  Write a sentence 1  Copy design 0  Total score 26   Cranial Nerves:    The pupils are equal, round, and reactive to light. The fundi are normal and spontaneous venous pulsations are present. Visual fields are full to finger confrontation. Extraocular movements are intact. Trigeminal sensation is intact and the muscles of mastication are normal. The face is symmetric. The palate elevates in the midline. Hearing intact. Voice is normal.  Shoulder shrug is normal. The tongue has normal motion without fasciculations.   Coordination:    Normal finger to nose and heel to shin. Normal rapid alternating movements.   Gait:    Heel-toe and tandem gait are normal.   Motor Observation:    No asymmetry, no atrophy, and no involuntary movements noted. Tone:    Normal muscle tone.    Posture:    Posture is normal. normal erect    Strength:    Strength is V/V in the upper and lower limbs.      Sensation: intact to LT     Reflex Exam:  DTR's:    Deep tendon reflexes in the upper and lower extremities are normal bilaterally.  Toes:    The toes are downgoing bilaterally.   Clonus:    Clonus is absent.       Assessment/Plan:  25 year old female with extensive restricted diffusion throughout the hippocampus bilaterally after drug overdose. Patient is seen today and has greatly improved. She can care for her own physical needs, no issues with ADLs and IADLs.  Mini-Mental Status exam is 26 out of 30 which is technically normal. She is still having some difficulty albeit mild short-term memory however patient is appropriate, conversant, and I feel as though she is competent at this point to make decisions regarding her medical care. She has improving memory, and is able to understand a rehabilitation program and the rules and regulations; her memory is sufficiently improved where I do feel she would be able to function within the guidleines of a rehabilitation program, understand the rules and participate fully. Patient should be admitted to a full time, inpatient rehabilitation program and it is my medical opinion she will do well.  We'll repeat MRI of the brain in 3 months. Continue Keppra at this time.  Patient is unable to drive, operate heavy machinery, perform activities at heights or participate in water activities until 6 months seizure free  Patients with epilepsy have a small risk of sudden unexpected death, a condition  referred to as sudden unexpected death in epilepsy (SUDEP). SUDEP is defined specifically as the sudden, unexpected, witnessed or unwitnessed, nontraumatic and nondrowning death in patients with epilepsy with or without evidence for a seizure, and excluding documented status epilepticus, in which post mortem examination does not reveal a structural or toxicologic cause for death     Naomie DeanAntonia Ahern, MD  [facility] [address] [place], [place] [id number]  Phone [phone] Fax [phone]  A total of 30 minutes was spent face-to-face with this patient. Over half this time was spent on counseling patient on the seizure and memory loss diagnosis and different diagnostic and therapeutic options available.

## 2016-02-07 NOTE — Patient Instructions (Signed)
Remember to drink plenty of fluid, eat healthy meals and do not skip any meals. Try to eat protein with a every meal and eat a healthy snack such as fruit or nuts in between meals. Try to keep a regular sleep-wake schedule and try to exercise daily, particularly in the form of walking, 20-30 minutes a day, if you can.   As far as your medications are concerned, I would like to suggest: continue Keppra until repeat MRI brain  As far as diagnostic testing: Would like to repeat MRI of the brain in 3 months  I would like to see you back in 3 months, sooner if we need to. Please call us with any interim questions, concerns, problems, updates or refill requests.   Our phone number is [phone]. We also have an after hours call service for urgent matters and there is a physician on-call for urgent questions. For any emergencies you know to call 911 or go to the nearest emergency room

## 2016-02-08 NOTE — Telephone Encounter (Signed)
Called pt father. Advised I faxed over OV note to attn Dorthey Sawyerecilia Stonebreaker at [facility]. Advised I received a fax confirmation. He verbalized understanding.

## 2016-02-08 NOTE — Telephone Encounter (Signed)
Noted, will contact mother once faxed. Spoke w/ AA, MD this am and note not completed yet. She will complete today so I can fax.

## 2016-02-08 NOTE — Telephone Encounter (Signed)
Patient's Mom Jorja LoaDenise Hash is calling stating she would like a call back when records are sent. She can be reached at [phone].

## 2016-02-08 NOTE — Telephone Encounter (Signed)
Pt's father called into see if letter has been faxed. I spoke with the nurse via skype and it has not been finished yet but the physician is working on it. I have notified the pt's father and he is appreciative. He is asking for a call to notify him when it has been faxed. Thank you  [phone]- Carola FrostWilliam Humphrey

## 2016-03-22 ENCOUNTER — Ambulatory Visit: Payer: PRIVATE HEALTH INSURANCE | Admitting: Obstetrics and Gynecology

## 2016-03-28 ENCOUNTER — Encounter: Payer: Self-pay | Admitting: Internal Medicine

## 2016-04-05 ENCOUNTER — Telehealth: Payer: Self-pay | Admitting: Neurology

## 2016-04-05 NOTE — Telephone Encounter (Signed)
I called patient. The patient had a seizure around the time she was abusing heroin and cocaine. She has been placed on Keppra, Dr. Lucia GaskinsAhern has indicated that she is to continue medications for now. I suspect in the future, she may be able come off of this medication the seizure was likely related to the drug abuse. The patient is not to drive until further notice.

## 2016-04-05 NOTE — Telephone Encounter (Signed)
Called and spoke to pt. Advised per [place] law she cannot drive until she is 6 months seizure free. She verbalized understanding. She stated she was not sure since she believes seizures were drug related. She is wanting to possibly come off keppra and how to wean off of this medication. She states she is wanting to know if she has to continue medication, how long she has to take it for.   She will be going to [facility] in 2-3 weeks. Advised Dr Lucia GaskinsAhern out of office until next Thursday. She would like Dr Anne HahnWillis to advise if possible. If she has to wait until AA,MD back in office next week, she is ok with waiting.

## 2016-04-05 NOTE — Telephone Encounter (Signed)
Pt called said she is wanting to wean off keppra. She said she is getting ready to be [facility] and will go to an [facility] and will be getting a job. She is wanting to drive. Please call

## 2016-05-02 ENCOUNTER — Encounter: Payer: Self-pay | Admitting: Obstetrics and Gynecology

## 2016-05-02 ENCOUNTER — Ambulatory Visit (INDEPENDENT_AMBULATORY_CARE_PROVIDER_SITE_OTHER): Payer: PRIVATE HEALTH INSURANCE | Admitting: Obstetrics and Gynecology

## 2016-05-02 VITALS — BP 120/72 | HR 92 | Resp 16 | Wt 182.0 lb

## 2016-05-02 DIAGNOSIS — R350 Frequency of micturition: Secondary | ICD-10-CM | POA: Diagnosis not present

## 2016-05-02 DIAGNOSIS — N898 Other specified noninflammatory disorders of vagina: Secondary | ICD-10-CM

## 2016-05-02 DIAGNOSIS — Z30431 Encounter for routine checking of intrauterine contraceptive device: Secondary | ICD-10-CM

## 2016-05-02 DIAGNOSIS — Z113 Encounter for screening for infections with a predominantly sexual mode of transmission: Secondary | ICD-10-CM | POA: Diagnosis not present

## 2016-05-02 DIAGNOSIS — A6000 Herpesviral infection of urogenital system, unspecified: Secondary | ICD-10-CM | POA: Diagnosis not present

## 2016-05-02 DIAGNOSIS — N762 Acute vulvitis: Secondary | ICD-10-CM

## 2016-05-02 LAB — POCT URINALYSIS DIPSTICK
Bilirubin, UA: NEGATIVE
GLUCOSE UA: NEGATIVE
Ketones, UA: NEGATIVE
Nitrite, UA: NEGATIVE
PH UA: 7
Protein, UA: NEGATIVE
UROBILINOGEN UA: NEGATIVE

## 2016-05-02 MED ORDER — VALACYCLOVIR HCL 500 MG PO TABS: ORAL_TABLET | ORAL | 2 refills | Status: AC

## 2016-05-02 NOTE — Progress Notes (Signed)
GYNECOLOGY  VISIT   HPI: 26 y.o.   Single  Caucasian  female   [id number] with Patient's last menstrual period was 04/28/2016.   here for   IUD recheck; patient also complains of frequent urination She had a kylena IUD inserted in 9/17. She has had a lot of spotting, not enough for a tampon. No real menses since October. No pain.  She c/o urinary frequency for months, small amounts. Some urgency to void. No dysuria. She is drinking caffeine. She vaps.  She has a h/o HSV, feels chaffed right now. She has Valtex to use prn.  She c/o a one week h/o vulvar irritation, more irritated on the right. Some thin white vaginal d/c, no odor.  The patient has a h/o drug abuse and is s/p overdose x 2 in the fall. She was hospitalized for 14 days in the fall, had seizures. She had some MRI changes, due for a f/u next month. On Keppra, not able to drive yet. No seizures since she was in the hospital. Still has some short term memory issues. [facility], moving tomorrow to an [employer]. Sober living, can have a job.  Family is very supportive.  She also has a h/o hepatitis C. Getting blood work at [facility].  Negative genprobe in 9/17.  Not sexually active since hospitalization in October. Not sure about before then.   GYNECOLOGIC HISTORY: Patient's last menstrual period was 04/28/2016. Contraception: Kyleena IUD120/72  Menopausal hormone therapy: N/A        OB History    Gravida Para Term Preterm AB Living   0 0 0 0 0 0   SAB TAB Ectopic Multiple Live Births   0 0 0 0 0         Patient Active Problem List   Diagnosis Date Noted  . Short-term memory loss   . Opioid overdose   . Aspiration pneumonia (HCC) 01/22/2016  . Hypokalemia 01/22/2016  . Extrapyramidal syndrome 01/22/2016  . UTI (urinary tract infection) 01/22/2016  . MDD (major depressive disorder), recurrent severe, without psychosis (HCC) 01/22/2016  . Overdose 01/19/2016  . Acute hypoxemic respiratory failure  (HCC)   . Cocaine abuse   . AKI (acute kidney injury) (HCC)   . Seizure (HCC)   . Dysmenorrhea 12/07/2015  . 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 Hepatitis, viral 12/07/2015  . History of substance abuse 12/07/2015    Past Medical History:  Diagnosis Date  . Anxiety   . Clotting disorder (HCC)   . Hepatitis C virus   . HSV-2 (herpes simplex virus 2) infection   . STD (sexually transmitted disease)    HSV II  . 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 arm surgery Right   . WRIST SURGERY Left    abcess     Current Outpatient Prescriptions  Medication Sig Dispense Refill  . busPIRone (BUSPAR) 7.5 MG tablet Take 1 tablet (7.5 mg total) by mouth 2 (two) times daily. 60 tablet 0  . escitalopram (LEXAPRO) 10 MG tablet Take 1 tablet (10 mg total) by mouth at bedtime. 30 tablet 0  . levETIRAcetam (KEPPRA) 500 MG tablet Take 1 tablet (500 mg total) by mouth 2 (two) times daily. 60 tablet 4  . Levonorgestrel (KYLEENA) 19.5 MG IUD 1 each by Intrauterine route continuous.    . traZODone (DESYREL) 100 MG tablet Take 100  mg by mouth at bedtime.  0  . valACYclovir (VALTREX) 500 MG tablet Take 1 tablet by mouth as needed.  1   No current facility-administered medications for this visit.      ALLERGIES: Haldol [haloperidol lactate]  Family History  Problem Relation Age of Onset  . Melanoma Mother   . Hypertension Father   . Hyperlipidemia Father   . Diabetes Maternal Grandmother   . Diabetes Maternal Grandfather     Social History   Social History  . Marital status: Single    Spouse name: N/A  . Number of children: 11  . Years of education: 0   Occupational History  . unemployed    Social History Main Topics  . Smoking status: Never Smoker  . Smokeless tobacco: Never Used  . Alcohol use No  . Drug use: Yes    Types: Cocaine     Comment: was clean for over 200 days prior to [facility] 2017;  heroin, crack, cocaine use  . Sexual activity: Yes    Partners: Male    Birth control/ protection: IUD   Other Topics Concern  . Not on file   Social History Narrative   Lives w/ parents   Caffeine use: 2cups tea per day   Soda- 2 per day    Review of Systems  Constitutional: Positive for chills.       Weight gain Craving sweets Excessive thirst  HENT: Negative.   Eyes: Negative.   Respiratory: Negative.   Cardiovascular: Negative.   Gastrointestinal: Negative.   Genitourinary: Positive for frequency and urgency.       Menstrual cycle changes Unscheduled bleeding or spotting  Musculoskeletal:       Muscle weakness Muscle or joint pain  Skin: Negative.   Neurological: Positive for seizures.  Endo/Heme/Allergies: Negative.   Psychiatric/Behavioral: Positive for depression. The patient is nervous/anxious.     PHYSICAL EXAMINATION:    BP 120/72 (BP Location: Right Arm, Patient Position: Sitting, Cuff Size: Normal)   Pulse 92   Resp 16   Wt 182 lb (82.6 kg)   LMP 04/28/2016   BMI 29.38 kg/m     General appearance: alert, cooperative and appears stated age   Pelvic: External genitalia:  Slight erythema, on the right labia majora ? Small ulcer vs local erosion              Urethra:  normal appearing urethra with no masses, tenderness or lesions              Bartholins and Skenes: normal                 Vagina: normal appearing vagina with normal color and discharge, no lesions              Cervix: no cervical motion tenderness, no lesions and IUD string 3-4 cm              Bimanual Exam:  Uterus:  normal size, contour, position, consistency, mobility, non-tender and anteverted              Adnexa: no mass, fullness, tenderness               Chaperone was present for exam.  ASSESSMENT IUD check, doing well Urinary frequency, may be secondary to caffeine use Screening STD Vulvitis, vaginal d/c    PLAN Urine for ua, c&s Screening STD Wet prep probe Refill  for Valtrex F/U for an annual exam in 9/18 Use condoms if sexually  active    An After Visit Summary was printed and given to the patient.  Over 15 minutes face to face time of which over 50% was spent in counseling.

## 2016-05-03 ENCOUNTER — Telehealth: Payer: Self-pay | Admitting: *Deleted

## 2016-05-03 LAB — URINALYSIS, MICROSCOPIC ONLY
CASTS: NONE SEEN [LPF]
RBC / HPF: NONE SEEN RBC/HPF (ref <=2)
Yeast: NONE SEEN [HPF]

## 2016-05-03 LAB — WET PREP BY MOLECULAR PROBE
Candida species: POSITIVE — AB
GARDNERELLA VAGINALIS: POSITIVE — AB
Trichomonas vaginosis: NEGATIVE

## 2016-05-03 LAB — GC/CHLAMYDIA PROBE AMP
CT Probe RNA: NOT DETECTED
GC PROBE AMP APTIMA: NOT DETECTED

## 2016-05-03 NOTE — Telephone Encounter (Signed)
-----   Message from Romualdo BolkJill Evelyn Jertson, MD sent at 05/03/2016  8:39 AM EST ----- Please inform the patient that her vaginitis probe was + for BV and treat with flagyl (either oral or vaginal, her choice), no ETOH while on Flagyl.  Oral: Flagyl 500 mg BID x 7 days, or Vaginal: Metrogel, 1 applicator per vagina q day x 5 days. Also + for yeast, treat with diflucan 150 mg x 1, may repeat in 72 hours if still symptomatic. #2, no refills Her urine culture is pending, she did have crystals in her urine, that could increase her risk of kidney stones. Please have her hydrate well.

## 2016-05-03 NOTE — Telephone Encounter (Signed)
Left message to call regarding lab results -eh 

## 2016-05-04 LAB — URINE CULTURE

## 2016-05-07 ENCOUNTER — Other Ambulatory Visit: Payer: Self-pay

## 2016-05-09 ENCOUNTER — Other Ambulatory Visit (INDEPENDENT_AMBULATORY_CARE_PROVIDER_SITE_OTHER): Payer: PRIVATE HEALTH INSURANCE

## 2016-05-09 DIAGNOSIS — Z113 Encounter for screening for infections with a predominantly sexual mode of transmission: Secondary | ICD-10-CM

## 2016-05-10 LAB — STD PANEL
HEP B S AG: NEGATIVE
HIV: NONREACTIVE

## 2016-05-10 MED ORDER — FLUCONAZOLE 150 MG PO TABS: 150.0000 mg | ORAL_TABLET | Freq: Once | ORAL | 0 refills | Status: AC

## 2016-05-10 MED ORDER — METRONIDAZOLE 500 MG PO TABS: 500.0000 mg | ORAL_TABLET | Freq: Two times a day (BID) | ORAL | 0 refills | Status: DC

## 2016-05-10 NOTE — Telephone Encounter (Signed)
Spoke with patient and gave wet prep results. Sent in RX for Diflucan and Flagyl to pharmacy per patient request

## 2016-05-14 ENCOUNTER — Ambulatory Visit (INDEPENDENT_AMBULATORY_CARE_PROVIDER_SITE_OTHER): Payer: PRIVATE HEALTH INSURANCE | Admitting: Neurology

## 2016-05-14 ENCOUNTER — Encounter: Payer: Self-pay | Admitting: Neurology

## 2016-05-14 VITALS — BP 132/76 | HR 94 | Ht 66.0 in | Wt 179.2 lb

## 2016-05-14 DIAGNOSIS — R4189 Other symptoms and signs involving cognitive functions and awareness: Secondary | ICD-10-CM

## 2016-05-14 MED ORDER — LEVETIRACETAM 500 MG PO TABS: 500.0000 mg | ORAL_TABLET | Freq: Two times a day (BID) | ORAL | 4 refills | Status: AC

## 2016-05-14 NOTE — Progress Notes (Signed)
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 Here with mother.  She feels improved. But not back to heslef. There is improvement however. She still gets her days mixed up and recent things she did gets mixed up. OT for cognitive therapy. No side effects to the keppra. She has been feeling fatigued. Having SOB but no chest pain. She is drinking 2 Red Bulls a day, advised not to and can cause cardiovascular problems. May discuss cutting back on Trazodone with prescriber. If that doesn;t work we can consider changing Keppra to ER at night only. Discussed cognitive therapy with OT and will refer her there. If she continues to have cognitive problems can refer for formal testing in the future.  HPI: Brandy CassHannah Millsis a 26 y.o.femalewith a history of drug abuse who presented with unresponsiveness that was presumed to be due to drug overdose. She was in the ED for some time and responded well to Narcan. She told her family that she used heroin mixed with cocaine, possibly laced with fentanyl. Several days later she was admitted again presumed drug overdose. She was found unresponsive in a car. She had possible seizure activity en route, and was given Versed by EMS. [facility] she had a GCS of 3 UDS was positive for benzos and cocaine. She was intubated and an EEG was performed on 10/12 which just showed changes typical of propofol sedation. Patient was treated with Keppra without recurrent seizures. MRI of the brain showed hippocampal diffusion changes and she had difficulty with new memories, she was discharged in stable and improving condition.  She has greatly improved since discharge, and is no longer encephalopathic with the exception of the fact that she  has some short-term memory mld complaints. Shs is here with her mother and appears appropriate. Her memory is improving. She is living with her memory. She still has some difficulty with memory but with prompting she remembers. She performs all her own ADLs independently, she is paying her own bills, she is not driving due to seizures, she is using her cell phone, she can remember being in the hospital, some memory with current issues. She appears to be appropriate, remembers the hospital.She can rememebr things from earlier in the day.   MRI of the brain 01/22/2016: IMPRESSION: 1. 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 This is compatible with seizures, potentially related to withdrawal. This may represent status epilepticus. Bilateral ischemic changes are considered much less likely.  Review of Systems: Patient complains of symptoms per HPI as well as the following symptoms: Feeling cold, memory loss, confusion, depression, anxiety. Pertinent negatives per HPI. All others negative.   Social History   Social History  . Marital status: Single    Spouse name: N/A  . Number of children: 11  . Years of education: 0   Occupational History  . unemployed    Social History Main Topics  . Smoking status: Never Smoker  . Smokeless tobacco: Never Used  . Alcohol use No  . Drug use: Yes    Types: Cocaine     Comment: was [facility] for over 200 days [facility]; heroin, crack, cocaine use  . Sexual activity: Yes    Partners: Male    Birth  control/ protection: IUD   Other Topics Concern  . Not on file   Social History Narrative   Lives w/ parents   Caffeine use: 2cups tea per day   Soda or [employer] - 2 per day    Family History  Problem Relation Age of Onset  . Melanoma Mother   . Hypertension Father   . Hyperlipidemia Father   . Diabetes Maternal Grandmother   . Diabetes Maternal Grandfather      Past Medical History:  Diagnosis Date  . Anxiety   . Clotting disorder (HCC)   . Hepatitis C virus   . HSV-2 (herpes simplex virus 2) infection   . STD (sexually transmitted disease)    HSV II  . 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 arm surgery Right   . WRIST SURGERY Left    abcess     Current Outpatient Prescriptions  Medication Sig Dispense Refill  . albuterol (PROVENTIL HFA;VENTOLIN HFA) 108 (90 Base) MCG/ACT inhaler Inhale into the lungs every 6 (six) hours as needed for wheezing or shortness of breath.    . busPIRone (BUSPAR) 7.5 MG tablet Take 1 tablet (7.5 mg total) by mouth 2 (two) times daily. 60 tablet 0  . escitalopram (LEXAPRO) 10 MG tablet Take 1 tablet (10 mg total) by mouth at bedtime. 30 tablet 0  . levETIRAcetam (KEPPRA) 500 MG tablet Take 1 tablet (500 mg total) by mouth 2 (two) times daily. 180 tablet 4  . Levonorgestrel (KYLEENA) 19.5 MG IUD 1 each by Intrauterine route continuous.    . metroNIDAZOLE (FLAGYL) 500 MG tablet Take 1 tablet (500 mg total) by mouth 2 (two) times daily. 14 tablet 0  . traZODone (DESYREL) 100 MG tablet Take 100 mg by mouth at bedtime.  0  . valACYclovir (VALTREX) 500 MG tablet Take 1 tablet BID x 3 days as needed. 30 tablet 2   No current facility-administered medications for this visit.     Allergies as of 05/14/2016 - Review Complete 05/14/2016  Allergen Reaction Noted  . Haldol [haloperidol lactate] Other (See Comments) 01/22/2016    Vitals: BP 132/76   Pulse 94   Ht 5\' 6"  (1.676 m)   Wt 179 lb 3.2 oz (81.3 kg)   LMP 04/28/2016   BMI 28.92 kg/m  Last Weight:  Wt Readings from Last 1 Encounters:  05/14/16 179 lb 3.2 oz (81.3 kg)   Last Height:   Ht Readings from Last 1 Encounters:  05/14/16 5\' 6"  (1.676 m)       Physical exam: Exam: Gen: NAD, conversant, well nourised, well groomed                     CV:  RRR, no MRG. No Carotid Bruits. No peripheral edema, warm, nontender Eyes: Conjunctivae clear without exudates or hemorrhage  Neuro: Detailed Neurologic Exam  Speech:    Speech is normal; fluent and spontaneous with normal comprehension.  Cognition:    The patient is oriented to person, place, and time;  MMSE - Mini Mental State Exam 02/07/2016  Orientation to time 5  Orientation to Place 5  Registration 3  Attention/ Calculation 5  Recall 1  Language- name 2 objects 2  Language- repeat 1  Language- follow 3 step command 2  Language- read & follow direction 1  Write a sentence 1  Copy design 0  Total score 26   Cranial Nerves:    The pupils are equal, round, and reactive to light. The fundi are normal and spontaneous venous pulsations are present. Visual fields are full to finger confrontation. Extraocular movements are intact. Trigeminal sensation is intact and the muscles of mastication are normal. The face is symmetric. The palate elevates in the midline. Hearing intact. Voice is normal. Shoulder shrug is normal. The tongue has normal motion without fasciculations.   Coordination:    Normal finger to nose and heel to shin. Normal rapid alternating movements.   Gait:    Heel-toe and tandem gait are normal.   Motor Observation:    No asymmetry, no atrophy, and no involuntary movements noted. Tone:    Normal muscle tone.    Posture:    Posture is normal. normal erect    Strength:    Strength is V/V in the upper and lower limbs.      Sensation: intact to LT     Reflex Exam:  DTR's:    Deep tendon reflexes in the upper and lower extremities are normal bilaterally.   Toes:    The toes are downgoing bilaterally.   Clonus:    Clonus is absent.       Assessment/Plan:  26 year old female with extensive restricted diffusion throughout the hippocampus bilaterally after drug overdose. Patient is seen today again and continues to improve but not back to  baseline. She can care for her own physical needs, no issues with ADLs and IADLs.  Mini-Mental Status exam was 26 out of 30 in 10/2017which is technically normal. She is still having some difficulty albeit mild short-term memory however patient is appropriate, conversant, and I feel as though she is competent at this point to make decisions regarding her medical care. She has improving memory, and is able to understand a rehabilitation program and the rules and regulations; her memory is sufficiently improved where I do feel she would be able to function within the guidleines of a rehabilitation program, understand the rules and participate fully. Patient should be admitted to a full time, inpatient rehabilitation program and it is my medical opinion she will do well.  We'll repeat MRI of the brain in 3 months; Discussed today but she has many medical bills, since she is improving will hold off Continue Keppra at this time for one-two years or until we can repeat MRI brain and eeg Will send to OT for cognitivetherpay  Patient is unable to drive, operate heavy machinery, perform activities at heights or participate in water activities until 6 months seizure free  Patients with epilepsy have a small risk of sudden unexpected death, a condition referred to as sudden unexpected death in epilepsy (SUDEP). SUDEP is defined specifically as the sudden, unexpected, witnessed or unwitnessed, nontraumatic and nondrowning death in patients with epilepsy with or without evidence for a seizure, and excluding documented status epilepticus, in which post mortem examination does not reveal a structural or toxicologic cause for death     Brandy Dean, MD  [facility] [address] [place], [place] [address]  Phone [phone] Fax [phone]  A total of 15 minutes was spent face-to-face with this patient. Over half this time was spent on counseling patient on the  seizure and memory loss diagnosis and different diagnostic and therapeutic options available.

## 2016-05-14 NOTE — Patient Instructions (Signed)
Remember to drink plenty of fluid, eat healthy meals and do not skip any meals. Try to eat protein with a every meal and eat a healthy snack such as fruit or nuts in between meals. Try to keep a regular sleep-wake schedule and try to exercise daily, particularly in the form of walking, 20-30 minutes a day, if you can.   As far as your medications are concerned, I would like to suggest: Continue Keppra  As far as diagnostic testing: Can consider repeat MRI brain and formal neurocognitive testing. Will send to OT cognitive therapy  I would like to see you back in 1 year, sooner if we need to. Please call us with any interim questions, concerns, problems, updates or refill requests.   Our phone number is [phone]. We also have an after hours call service for urgent matters and there is a physician on-call for urgent questions. For any emergencies you know to call 911 or go to the nearest emergency room

## 2016-05-15 ENCOUNTER — Telehealth: Payer: Self-pay | Admitting: Obstetrics and Gynecology

## 2016-05-15 MED ORDER — METRONIDAZOLE 0.75 % VA GEL: 1.0000 | Freq: Every day | VAGINAL | 0 refills | Status: DC

## 2016-05-15 NOTE — Telephone Encounter (Signed)
Spoke with patient. Patient states she started Flagyl po on 2/2 or 2/3 for BV. Patient state she can not continue to take d/t abdominal cramping. Patient states she has only been taking 1 at night last couple of nights. Patient would like to switch to gel. Patient states she has 7 pills left. Advised patient would review with Dr. Oscar LaJertson and return call, patient is agreeable.  Dr. Oscar LaJertson, ok to switch to metrogel?

## 2016-05-15 NOTE — Telephone Encounter (Signed)
Left message. Advised patient request prescription has been sent to pharmacy on file. Advised office is closed, may return call to [phone] after 0800 on 2/7 for any additional questions.  Routing to provider for final review. Patient is agreeable to disposition. Will close encounter.

## 2016-05-15 NOTE — Telephone Encounter (Signed)
Patient has a question for a nurse about her medication.

## 2016-05-28 ENCOUNTER — Other Ambulatory Visit (HOSPITAL_COMMUNITY): Payer: Self-pay | Admitting: Respiratory Therapy

## 2016-05-28 DIAGNOSIS — J45909 Unspecified asthma, uncomplicated: Secondary | ICD-10-CM

## 2016-05-30 ENCOUNTER — Encounter: Payer: Self-pay | Admitting: Occupational Therapy

## 2016-05-30 ENCOUNTER — Ambulatory Visit: Payer: PRIVATE HEALTH INSURANCE | Attending: Neurology | Admitting: Occupational Therapy

## 2016-05-30 DIAGNOSIS — R41844 Frontal lobe and executive function deficit: Secondary | ICD-10-CM | POA: Insufficient documentation

## 2016-05-30 DIAGNOSIS — R41840 Attention and concentration deficit: Secondary | ICD-10-CM | POA: Insufficient documentation

## 2016-05-30 NOTE — Therapy (Signed)
[facility] [address] [place], [place], [address] Phone: [phone]   Fax:  [phone]  Occupational Therapy Evaluation  Patient Details  Name: Brandy Sherman MRN: [id number] Date of Birth: Sep 30, 1990 Referring Provider: Dr. Naomie Dean  Encounter Date: 05/30/2016      OT End of Session - 05/30/16 1653    Visit Number 1   Number of Visits 9   Date for OT Re-Evaluation 07/28/16   Authorization Type [employer], 25 visit limit for OT   OT Start Time 1450   OT Stop Time 1535   OT Time Calculation (min) 45 min   Activity Tolerance Patient tolerated treatment well   Behavior During Therapy Flat affect      Past Medical History:  Diagnosis Date  . Anxiety   . Clotting disorder (HCC)   . Hepatitis C virus   . HSV-2 (herpes simplex virus 2) infection   . STD (sexually transmitted disease)    HSV II  . 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 arm surgery Right   . WRIST SURGERY Left    abcess     There were no vitals filed for this visit.      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 No/denies           [facility] OT Assessment - 05/30/16 0001      Assessment   Diagnosis cognitive issues   Referring Provider Dr. Naomie Dean   Assessment Epic notes indicate hypoxic injury due to overdose, seizure, and MRI indicated hippocampal diffusion changes   Prior Therapy cognitive testing at treatment center      Precautions   Precautions --  no driving (until seizure free for 6 months)     Balance Screen   Has the patient fallen in the past 6 months No     Home  Environment   Family/patient expects to be discharged to: --  [facility])   Additional Comments has chores that are requried and must look for a job     Prior Function   Level of Independence Independent  however, substance abuse difficulties    Vocation Unemployed  no GED, completed 11th grade   Vocation Requirements previously worked as a Production assistant, 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, nature     ADL   ADL comments Pt reports performing BADLs independently     IADL   Light Housekeeping --  performing light-mod tasks, forgot new chore   Prior Level of Function Meal Prep cooked very little   Meal Prep --  not currently cooking, eating fast food   Prior Level of Function Community Mobility Previously drove, pt not supposed to drive due to seizure hx within last 6 months, but pt reports having driven some   Engineer, drilling on family or friends for transportation  roommates, Pharmacist, community   Medication Management --  use med Dance movement psychotherapist, forgets occasionally   Prior Level  of Function Financial Management pt reports difficulty with change making/math prior, but currently needs cueing for basic math   Financial Management --  impaired     Mobility   Mobility Status Independent     Written Expression   Dominant Hand Right   Handwriting 100% legible     Vision Assessment   Vision Assessment Vision not tested     Cognition   Overall Cognitive Status Impaired/Different from baseline   Mini Mental State Exam  --  MOCA score of 23/30 +1 (normal > or =26)    Area of Impairment Memory;Awareness;Attention   Current Attention Level Selective  for eval   Attention Comments MOCA attention section with 4/6   Memory Decreased short-term memory   Memory Comments 2/5 word during delayed recall   Awareness Intellectual    Awareness Comments difficulty describing deficits  difficulty tracking dates   Problem Solving Impaired   Executive Function --  difficulty, incr time   Behaviors --  flat                           OT Short Term Goals - 05/30/16 1709      OT SHORT TERM GOAL #1   Title Pt will verbalize understanding of memory/cognitive compensation strategies.--check STGs 06/27/16   Time 4   Period Weeks   Status New     OT SHORT TERM GOAL #2   Title Pt will perform simple money management/change making in prep for work activities with 75% accuracy.   Time 4   Period Weeks   Status New     OT SHORT TERM GOAL #3   Title Pt will perform mod-complex to complex planning/problem solving activities with at least 75% accuracy.   Time 4   Period Weeks   Status New     OT SHORT TERM GOAL #4   Title Pt will be able to alternate attention between at least 2 tasks with at least 85% accuracy in prep for work tasks.   Time 4   Period Weeks   Status New           OT Long Term Goals - 05/30/16 1707      OT LONG TERM GOAL #1   Title Pt will utilize memory compensation strategies successfully at least 85% of the time.--check LTGs 07/28/16   Time 8   Period Weeks   Status New     OT LONG TERM GOAL #2   Title Pt will perform simple money management/change making in prep for work activities with 90% accuracy.   Time 8   Period Weeks   Status New     OT LONG TERM GOAL #3   Title Pt will perform mod-complex to complex planning/problem solving activities with at least 90% accuracy.   Time 8   Period Weeks   Status New     OT LONG TERM GOAL #4   Title Pt will be able to divide attention between at least 2 tasks with at least 90% accuracy in prep for work tasks.   Time 8   Period Weeks   Status New     OT LONG TERM GOAL #5   Title Pt will perform simple cooking task safely without cueing.   Time 8   Period Weeks   Status New               Plan - 05/30/16 1654     Clinical Impression Statement  Pt is a 26 y.o. female referred to outpatient occupational therapy for cognitive issues.  Per pt/epic notes, pt with cocaine/herione overdose x2 over 2 days.  With 2nd overdose, pt was admitted to [facility] and was in ICU for 3 days with intubation and supected hypoxic injury due to overdose.  Pt also s/p seizure and MRI showed hippocampal diffusion changes.  Pt reports significant improvement since hospitalization 10/17, but continued to report difficulty with memory.  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  Pt would benefit from occupational therapy to address cognitive deficits/compensation strategies for improved ADL/IADL performance and to assist with being successful obtaining/keeping a job.   Rehab Potential Fair   OT Frequency 1x / week  per pt request due to transportation issues   OT Duration 8 weeks  +eval   OT Treatment/Interventions Self-care/ADL training;DME and/or AE instruction;Patient/family education;Therapeutic activities;Therapeutic exercise;Neuromuscular education;Cognitive remediation/compensation;Energy conservation;Visual/perceptual remediation/compensation   Plan memory compensation strategies, complex planning tasks, money management   Recommended Other Services Vocational Rehab (pt reports that she is already scheduled for initial appt).   Consulted and Agree with Plan of Care Patient      Patient will benefit from skilled therapeutic intervention in order to improve the following deficits and impairments:     Visit Diagnosis: Frontal lobe and executive function deficit  Attention and concentration deficit    Problem List Patient Active Problem List   Diagnosis Date Noted  . Short-term memory loss   . Opioid overdose   . Aspiration pneumonia (HCC) 01/22/2016  . Hypokalemia 01/22/2016  . Extrapyramidal syndrome 01/22/2016  . UTI (urinary tract infection) 01/22/2016  .  MDD (major depressive disorder), recurrent severe, without psychosis (HCC) 01/22/2016  . Overdose 01/19/2016  . Acute hypoxemic respiratory failure (HCC)   . Cocaine abuse   . AKI (acute kidney injury) (HCC)   . Seizure (HCC)   . Dysmenorrhea 12/07/2015  . 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 Hepatitis, viral 12/07/2015  . History of substance abuse 12/07/2015    [facility] 05/30/2016, 5:16 PM  [facility] [address] [place], [place], [address] Phone: [phone]   Fax:  [phone]  Name: Brandy Sherman MRN: [id number] Date of Birth: April 13, 1990

## 2016-06-04 ENCOUNTER — Telehealth: Payer: Self-pay | Admitting: Nurse Practitioner

## 2016-06-04 ENCOUNTER — Ambulatory Visit: Payer: PRIVATE HEALTH INSURANCE | Admitting: Nurse Practitioner

## 2016-06-04 NOTE — Telephone Encounter (Signed)
Patient did not keep her appointment for possible BV or a possible yeast infection for this afternoon.  The appointment was scheduled one hour prior to her visit but the patient said, "I stopped at work to drop something off and completely forgot about the appointment. I'm having memory problems and I am not sure I need to reschedule at this time."  Patient stated she will call back to reschedule if her symptoms worsen.

## 2016-06-04 NOTE — Progress Notes (Deleted)
26 y.o. Single {Race/ethnicity:17218} female [id number] here with complaint of vaginal symptoms of itching, burning, and increase discharge. Describes discharge as ***. Onset of symptoms *** days ago. Denies new personal products or vaginal dryness. *** STD concerns. Urinary symptoms *** . Contraception is ***.   O:  Healthy female WDWN Affect: normal, orientation x 3  Exam: Abdomen: Lymph node: no enlargement or tenderness Pelvic exam: External genital: normal female BUS: negative Vagina: *** discharge noted.  Affirm taken. Cervix: normal, non tender, no CMT Uterus: normal, non tender Adnexa:normal, non tender, no masses or fullness noted    A: Vaginitis   P: Discussed findings of vaginitis and etiology. Discussed Aveeno or baking soda sitz bath for comfort. Avoid moist clothes or pads for extended period of time. If working out in gym clothes or swim suits for long periods of time change underwear or bottoms of swimsuit if possible. Olive Oil/Coconut Oil use for skin protection prior to activity can be used to external skin.  Rx: ***  Follow with Affirm  Rv prn

## 2016-06-04 NOTE — Telephone Encounter (Signed)
OK to close

## 2016-06-05 ENCOUNTER — Encounter (HOSPITAL_COMMUNITY): Payer: Self-pay

## 2016-06-08 ENCOUNTER — Encounter (HOSPITAL_COMMUNITY): Payer: Self-pay

## 2016-06-11 ENCOUNTER — Ambulatory Visit: Payer: PRIVATE HEALTH INSURANCE | Attending: Neurology | Admitting: Occupational Therapy

## 2016-06-11 DIAGNOSIS — R41844 Frontal lobe and executive function deficit: Secondary | ICD-10-CM | POA: Insufficient documentation

## 2016-06-11 DIAGNOSIS — R41840 Attention and concentration deficit: Secondary | ICD-10-CM | POA: Insufficient documentation

## 2016-06-11 NOTE — Patient Instructions (Signed)
Memory Compensation Strategies  1. Use "WARM" strategy. W= write it down.  Take a notebook and pen with you to interviews/appointments A=  associate it R=  repeat it M=  make a mental picture  2. You can keep a Glass blower/designerMemory Notebook. Use a 3-ring notebook with sections for the following:  calendar, important names and phone numbers, medications, doctors' names/phone numbers, "to do list"/reminders, and a section to journal what you did each day  3. Use a calendar to write appointments down.  4. Write yourself a schedule for the day.  This can be placed on the calendar or in a separate section of the Memory Notebook.  Keeping a regular schedule can help memory.  5. Use medication organizer with sections for each day or morning/evening pills  You may need help loading it  6. Keep a basket, or pegboard by the door.   Place items that you need to take out with you in the basket or on the pegboard.  You may also want to include a message board for reminders.  7. Use sticky notes. Place sticky notes with reminders in a place where the task is performed.  For example:  "turn off the stove" placed by the stove, "lock the door" placed on the door at eye level, "take your medications" on the bathroom mirror or by the place where you normally take your medications  8. Use alarms/timers.  Use while cooking to remind yourself to check on food or as a reminder to take your medicine, or as a reminder to make a call, or as a reminder to perform another task, etc.  9. Use a phone/small tape recorder to record important information and notes for yourself.

## 2016-06-11 NOTE — Therapy (Signed)
[facility] [address] [place], [place], [address] Phone: [phone]   Fax:  [phone]  Occupational Therapy Treatment  Patient Details  Name: Brandy Sherman MRN: [id number] Date of Birth: 1990-10-16 Referring Provider: Dr. Sarina Ill  Encounter Date: 06/11/2016      OT End of Session - 06/11/16 1501    Visit Number 2   Number of Visits 9   Date for OT Re-Evaluation 07/28/16   Authorization Type [employer], 25 visit limit for OT   OT Start Time 1450   OT Stop Time 1530   OT Time Calculation (min) 40 min   Activity Tolerance Patient tolerated treatment well   Behavior During Therapy Flat affect;WFL for tasks assessed/performed      Past Medical History:  Diagnosis Date  . Anxiety   . Clotting disorder ([place])   . Hepatitis C virus   . HSV-2 (herpes simplex virus 2) infection   . STD (sexually transmitted disease)    HSV II  . 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 arm surgery Right   . WRIST SURGERY Left    abcess     There were no vitals filed for this visit.      Subjective Assessment - 06/11/16 1453    Subjective  Pt has a job interview with [employer], [employer].  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  Also went to [facility] for orientation today.   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 No/denies      Organization:  Organizing shopping list to make categories to make shopping easier with good accuracy.  Simple-mod complex functional problem solving involving time.  Pt with approx 50-60% accuracy and needed mod cueing  for attention to detail and multi-step problem solving/reasoning.  Discussed difficulties to incr awareness and how this may impact potential work duties.  Pt verbalized understanding.                          OT Education - 06/11/16 1459    Education provided Yes   Education Details Memory Compensation Strategies   Person(s) Educated Patient   Methods Explanation;Handout   Comprehension Verbalized understanding          OT Short Term Goals - 05/30/16 1709      OT SHORT TERM GOAL #1   Title Pt will verbalize understanding of memory/cognitive compensation strategies.--check STGs 06/27/16   Time 4   Period Weeks   Status New     OT SHORT TERM GOAL #2   Title Pt will perform simple money management/change making in prep for work activities with 75% accuracy.   Time 4   Period Weeks   Status New     OT SHORT TERM GOAL #3   Title Pt will perform mod-complex to complex planning/problem solving activities with at least 75% accuracy.   Time 4   Period Weeks   Status New     OT SHORT TERM GOAL #4   Title Pt will be able to alternate attention between at least 2 tasks with at least 85% accuracy in prep for  work tasks.   Time 4   Period Weeks   Status New           OT Long Term Goals - 05/30/16 1707      OT LONG TERM GOAL #1   Title Pt will utilize memory compensation strategies successfully at least 85% of the time.--check LTGs 07/28/16   Time 8   Period Weeks   Status New     OT LONG TERM GOAL #2   Title Pt will perform simple money management/change making in prep for work activities with 90% accuracy.   Time 8   Period Weeks   Status New     OT LONG TERM GOAL #3   Title Pt will perform mod-complex to complex planning/problem solving activities with at least 90% accuracy.   Time 8   Period Weeks   Status New     OT LONG TERM GOAL #4   Title Pt will be able to divide attention between at least 2 tasks with at least 90% accuracy in prep for  work tasks.   Time 8   Period Weeks   Status New     OT LONG TERM GOAL #5   Title Pt will perform simple cooking task safely without cueing.   Time 8   Period Weeks   Status New               Plan - 06/11/16 1501    Clinical Impression Statement Pt verbalized understanding of memory compensation strategies for ADLs/IADLs and potential work tasks.  Pt has met with Voc Rehab and has interview for job tomorrow.     Rehab Potential Fair   OT Frequency 1x / week  per pt request due to transportation issues   OT Duration 8 weeks  +eval   OT Treatment/Interventions Self-care/ADL training;DME and/or AE instruction;Patient/family education;Therapeutic activities;Therapeutic exercise;Neuromuscular education;Cognitive remediation/compensation;Energy conservation;Visual/perceptual remediation/compensation   Plan mod complex planning-complex/problem solving tasks, money management   OT Home Exercise Plan Education provided:  memory compensation strategies   Consulted and Agree with Plan of Care Patient      Patient will benefit from skilled therapeutic intervention in order to improve the following deficits and impairments:  Decreased cognition, Decreased knowledge of use of DME, Decreased safety awareness  Visit Diagnosis: Frontal lobe and executive function deficit  Attention and concentration deficit    Problem List Patient Active Problem List   Diagnosis Date Noted  . Short-term memory loss   . Opioid overdose   . Aspiration pneumonia ([place]) 01/22/2016  . Hypokalemia 01/22/2016  . Extrapyramidal syndrome 01/22/2016  . UTI (urinary tract infection) 01/22/2016  . MDD (major depressive disorder), recurrent severe, without psychosis ([place]) 01/22/2016  . Overdose 01/19/2016  . Acute hypoxemic respiratory failure ([place])   . Cocaine abuse   . AKI (acute kidney injury) ([place])   . Seizure ([place])   . Dysmenorrhea 12/07/2015  . 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 Hepatitis, viral 12/07/2015   . History of substance abuse 12/07/2015    [facility] 06/12/2016, 8:27 AM  [facility] [address] [place], [place], [address] Phone: [phone]   Fax:  [phone]  Name: Brandy Sherman MRN: [id number] Date of Birth: February 21, 1991   Vianne Bulls, OTR/L [facility] [address] [place], [place]  [address] [phone] phone [phone] 06/12/16 8:27 AM

## 2016-06-12 ENCOUNTER — Telehealth: Payer: Self-pay

## 2016-06-12 NOTE — Telephone Encounter (Signed)
Received faxed psychological eval from [facility].

## 2016-06-18 ENCOUNTER — Ambulatory Visit: Payer: PRIVATE HEALTH INSURANCE | Admitting: Occupational Therapy

## 2016-06-25 ENCOUNTER — Ambulatory Visit: Payer: PRIVATE HEALTH INSURANCE | Admitting: Occupational Therapy

## 2016-06-27 ENCOUNTER — Encounter: Payer: PRIVATE HEALTH INSURANCE | Admitting: Internal Medicine

## 2016-07-02 ENCOUNTER — Ambulatory Visit: Payer: PRIVATE HEALTH INSURANCE | Admitting: Occupational Therapy

## 2016-07-09 ENCOUNTER — Ambulatory Visit: Payer: PRIVATE HEALTH INSURANCE | Attending: Neurology | Admitting: Occupational Therapy

## 2016-07-19 DIAGNOSIS — Z0289 Encounter for other administrative examinations: Secondary | ICD-10-CM

## 2016-07-25 ENCOUNTER — Ambulatory Visit (INDEPENDENT_AMBULATORY_CARE_PROVIDER_SITE_OTHER): Payer: PRIVATE HEALTH INSURANCE | Admitting: Internal Medicine

## 2016-07-25 ENCOUNTER — Encounter: Payer: Self-pay | Admitting: Internal Medicine

## 2016-07-25 DIAGNOSIS — B182 Chronic viral hepatitis C: Secondary | ICD-10-CM

## 2016-07-25 LAB — CBC WITH DIFFERENTIAL/PLATELET
BASOS PCT: 0 %
Basophils Absolute: 0 cells/uL (ref 0–200)
EOS ABS: 45 {cells}/uL (ref 15–500)
Eosinophils Relative: 1 %
HEMATOCRIT: 41.5 % (ref 35.0–45.0)
HEMOGLOBIN: 14 g/dL (ref 11.7–15.5)
LYMPHS ABS: 1215 {cells}/uL (ref 850–3900)
LYMPHS PCT: 27 %
MCH: 30.6 pg (ref 27.0–33.0)
MCHC: 33.7 g/dL (ref 32.0–36.0)
MCV: 90.8 fL (ref 80.0–100.0)
MONO ABS: 315 {cells}/uL (ref 200–950)
MPV: 11.4 fL (ref 7.5–12.5)
Monocytes Relative: 7 %
NEUTROS PCT: 65 %
Neutro Abs: 2925 cells/uL (ref 1500–7800)
Platelets: 212 10*3/uL (ref 140–400)
RBC: 4.57 MIL/uL (ref 3.80–5.10)
RDW: 13.7 % (ref 11.0–15.0)
WBC: 4.5 10*3/uL (ref 3.8–10.8)

## 2016-07-25 LAB — COMPLETE METABOLIC PANEL WITH GFR
ALT: 74 U/L — AB (ref 6–29)
AST: 50 U/L — ABNORMAL HIGH (ref 10–30)
Albumin: 4.2 g/dL (ref 3.6–5.1)
Alkaline Phosphatase: 35 U/L (ref 33–115)
BUN: 9 mg/dL (ref 7–25)
CALCIUM: 9.4 mg/dL (ref 8.6–10.2)
CO2: 26 mmol/L (ref 20–31)
CREATININE: 0.75 mg/dL (ref 0.50–1.10)
Chloride: 103 mmol/L (ref 98–110)
GFR, Est Non African American: >89 mL/min (ref >=60)
Glucose, Bld: 58 mg/dL — ABNORMAL LOW (ref 65–99)
Potassium: 3.8 mmol/L (ref 3.5–5.3)
Sodium: 140 mmol/L (ref 135–146)
TOTAL PROTEIN: 7.6 g/dL (ref 6.1–8.1)
Total Bilirubin: 0.6 mg/dL (ref 0.2–1.2)

## 2016-07-25 LAB — HIV ANTIBODY (ROUTINE TESTING W REFLEX): HIV 1&2 Ab, 4th Generation: NONREACTIVE

## 2016-07-25 NOTE — Progress Notes (Signed)
[facility]   CC: consideration for treatment for chronic hepatitis C  HPI:  +Brandy Sherman is a 26 y.o. female who presents for initial evaluation and management of chronic hepatitis C.  Patient tested positive about 2 years ago. Hepatitis C-associated risk factors present are: IV drug abuse (details: last used over 3 months ago). Patient denies history of blood transfusion. Patient has not had other studies performed. Results: none. Patient has had prior treatment for Hepatitis C. Patient does not have a past history of liver disease. Patient does not have a family history of liver disease. Patient does not  have associated signs or symptoms related to liver disease.  Records reviewed from recent hospitalizations.  Had a positive drug screen in 2017 and was hospitalized then but has been in drug rehab and now feels she is clear of substance abuse.   She previously started Harvoni but only took for a short time, does not really remember for how long.  Does not really remember where she got it.      Patient does not have documented immunity to Hepatitis A. Patient does not have documented immunity to Hepatitis B.    Review of Systems:  Constitutional: negative for fatigue and malaise Gastrointestinal: negative for diarrhea Integument/breast: negative for rash and pruritus Musculoskeletal: negative for myalgias and arthralgias All other systems reviewed and are negative       Past Medical History:  Diagnosis Date  . Anxiety   . Clotting disorder ([facility])   . Hepatitis C virus   . HSV-2 (herpes simplex virus 2) infection   . STD (sexually transmitted disease)    HSV II  . 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 Authorizing Provider  busPIRone (BUSPAR) 7.5 MG tablet Take 1 tablet (7.5 mg total) by mouth 2 (two) times daily. 01/26/16  Yes Alison Murray, MD  escitalopram (LEXAPRO) 10 MG tablet Take 1 tablet (10 mg total) by mouth at bedtime. 01/26/16  Yes Alison Murray, MD  levETIRAcetam (KEPPRA) 500 MG tablet Take 1 tablet (500 mg total) by mouth 2 (two) times daily. 05/14/16  Yes Anson Fret, MD  Levonorgestrel [facility]) 19.5 MG IUD 1 each by Intrauterine route continuous.   Yes Historical Provider, MD  traZODone (DESYREL) 100 MG tablet Take 150 mg by mouth at bedtime.  11/05/15  Yes Historical Provider, MD  albuterol (PROVENTIL HFA;VENTOLIN HFA) 108 (90 Base) MCG/ACT inhaler Inhale into the lungs every 6 (six) hours as needed for wheezing or shortness of breath.    Historical Provider, MD  valACYclovir (VALTREX) 500 MG tablet Take 1 tablet BID x 3 days as needed. Patient not taking: Reported on 07/25/2016 05/02/16   Romualdo Bolk, MD    Allergies  Allergen Reactions  . Haldol [Haloperidol Lactate] Other (See Comments)    Patient develops dystonia, extrapyramidal syndrome.     Social History  Substance Use Topics  . Smoking status: Current Every Day Smoker    Types: E-cigarettes    Start date: 04/09/2013  . Smokeless tobacco: Never Used  . Alcohol use No    Family History  Problem Relation Age of Onset  . Melanoma Mother   . Hypertension Father   . Hyperlipidemia Father   . Diabetes Maternal Grandmother   . Diabetes Maternal Grandfather   no cirrhosis or liver  cancer    Objective:  Constitutional: in no apparent distress,  Vitals:   07/25/16 1037  BP: 116/70  Pulse: 69  Temp: 97.9 F (36.6 C)   Eyes: anicteric Cardiovascular: Cor RRR and No murmurs Respiratory: CTA B; normal respiratory effort Gastrointestinal: Bowel sounds are normal, liver is not enlarged, spleen is not enlarged, soft, nt, nd Musculoskeletal: no pedal edema noted Skin: negatives: no rash; no porphyria cutanea tarda Lymphatic: no cervical lymphadenopathy   Laboratory Genotype: No results found for: HCVGENOTYPE HCV viral load: No  results found for: HCVQUANT Lab Results  Component Value Date   WBC 4.8 01/23/2016   HGB 11.2 (L) 01/23/2016   HCT 33.8 (L) 01/23/2016   MCV 84.5 01/23/2016   PLT 257 01/23/2016    Lab Results  Component Value Date   CREATININE 0.68 01/25/2016   BUN <5 (L) 01/25/2016   NA 137 01/25/2016   K 3.6 01/25/2016   CL 104 01/25/2016   CO2 25 01/25/2016    Lab Results  Component Value Date   ALT 47 01/22/2016   AST 38 01/22/2016   ALKPHOS 32 (L) 01/22/2016     Labs and history reviewed and show CHILD-PUGH unknown  5-6 points: Child class A 7-9 points: Child class B 10-15 points: Child class C  Lab Results  Component Value Date   BILITOT 0.4 01/22/2016   ALBUMIN 2.8 (L) 01/22/2016     Assessment: New Patient with Chronic Hepatitis C genotype unknown, untreated.  I discussed with the patient the lab findings that confirm chronic hepatitis C as well as the natural history and progression of disease including about 30% of people who develop cirrhosis of the liver if left untreated and once cirrhosis is established there is a 2-7% risk per year of liver cancer and liver failure.  I discussed the importance of treatment and benefits in reducing the risk, even if significant liver fibrosis exists.   Plan: 1) Patient counseled extensively on limiting acetaminophen to no more than 2 grams daily, avoidance of alcohol. 2) Transmission discussed with patient including sexual transmission, sharing razors and toothbrush.   3) Will need referral to gastroenterology if concern for cirrhosis 4) Will need referral for substance abuse counseling: No.; Further work up to include urine drug screen  No. she is in counseling now 5) Will prescribe appropriate medication based on genotype and coverage  6) Hepatitis A and B titers 7) Further work up to include liver staging with Fibrosure. I will not order elastography since she has had hepatitis C a short time, unless there are concerns on the  Fibrosure.   8) will follow up after starting medication 9)  With a really limited time of DAA exposure, I do not feel treatment as relapse is indicated so will use appropriate therapy based on genotype, which is likely genotype 1 unless she was reinfected with her last relapse of drug use.

## 2016-07-25 NOTE — Patient Instructions (Signed)
Date 07/25/16  Dear Ms Brandy Sherman, As discussed in the ID Clinic, your hepatitis C therapy will include highly effective medication(s) for treatment and will vary based on the type of hepatitis C and insurance approval.  Potential medications include:          Harvoni (sofosbuvir /ledipasvir ) tablet oral daily          OR     Epclusa (sofosbuvir /velpatasvir ) tablet oral daily          OR      Mavyret (glecaprevir 100 mg/pibrentasvir 40 mg): Take 3 tablets oral daily          OR     Zepatier (elbasvir 50 mg/grazoprevir 100 mg) oral daily, +/- ribavirin              Medications are typically for 8 or 12 weeks total ---------------------------------------------------------------- Your HCV Treatment Start Date: You will be notified by our office once the medication is approved and where you can pick it up (or if mailed)   ---------------------------------------------------------------- YOUR PHARMACY CONTACT:   [facility] [address] [place], [place] [address] Phone: [phone] Hours: Monday to Friday 7:30 am to 6:00 pm   Please always contact your pharmacy at least 3-4 business days before you run out of medications to ensure your next month's medication is ready or 1 week prior to running out if you receive it by mail.  Remember, each prescription is for 28 days. ---------------------------------------------------------------- GENERAL NOTES REGARDING YOUR HEPATITIS C MEDICATION:  Some medications have the following interactions:  - Acid reducing agents such as H2 blockers (ie. Pepcid (famotidine), Zantac (ranitidine), Tagamet (cimetidine), Axid (nizatidine) and proton pump inhibitors (ie. Prilosec (omeprazole), Protonix (pantoprazole), Nexium (esomeprazole), or Aciphex (rabeprazole)). Do not take until you have discussed with a health care provider.    -Antacids that contain magnesium and/or aluminum hydroxide (ie. Milk of Magensia, Rolaids,  Gaviscon, Maalox, Mylanta, an dArthritis Pain Formula).  -Calcium carbonate (calcium supplements or antacids such as Tums, Caltrate, Os-Cal).  -St. John's wort or any products that contain St. John's wort like some herbal supplements  Please inform the office prior to starting any of these medications.  - The common side effects associated with Harvoni include:      1. Fatigue      2. Headache      3. Nausea      4. Diarrhea      5. Insomnia  Please note that this only lists the most common side effects and is NOT a comprehensive list of the potential side effects of these medications. For more information, please review the drug information sheets that come with your medication package from the pharmacy.  ---------------------------------------------------------------- GENERAL HELPFUL HINTS ON HCV THERAPY: 1. Stay well-hydrated. 2. Notify the ID Clinic of any changes in your other over-the-counter/herbal or prescription medications. 3. If you miss a dose of your medication, take the missed dose as soon as you remember. Return to your regular time/dose schedule the next day.  4.  Do not stop taking your medications without first talking with your healthcare provider. 5.  You may take Tylenol (acetaminophen), as long as the dose is less than 2000 mg (OR no more than 4 tablets of the Tylenol Extra Strengths  tablet) in 24 hours. 6.  You will see our pharmacist-specialist within the first 2 weeks of starting your medication to monitor for any possible side effects. 7.  You will have labs once during treatment, soon  after treatment completion and one final lab 6 months after treatment completion to verify the virus is out of your system.  Scharlene Gloss, [place] for [place], [place]  [address] [phone]

## 2016-07-26 LAB — HEPATITIS A ANTIBODY, TOTAL: HEP A TOTAL AB: REACTIVE — AB

## 2016-07-26 LAB — HEPATITIS B SURFACE ANTIGEN: HEP B S AG: NEGATIVE

## 2016-07-26 LAB — PROTIME-INR
INR: 1
Prothrombin Time: 10.7 s (ref 9.0–11.5)

## 2016-07-26 LAB — HEPATITIS B SURFACE ANTIBODY,QUALITATIVE: HEP B S AB: POSITIVE — AB

## 2016-07-26 LAB — HEPATITIS B CORE ANTIBODY, TOTAL: HEP B C TOTAL AB: REACTIVE — AB

## 2016-07-28 LAB — LIVER FIBROSIS, FIBROTEST-ACTITEST
ALT: 74 U/L — ABNORMAL HIGH (ref 6–29)
APOLIPOPROTEIN A1: 155 mg/dL (ref 101–198)
Alpha-2-Macroglobulin: 216 mg/dL (ref 106–279)
BILIRUBIN: 0.4 mg/dL (ref 0.2–1.2)
Fibrosis Score: 0.06
GGT: 10 U/L (ref 3–40)
HAPTOGLOBIN: 96 mg/dL (ref 43–212)
Necroinflammat ACT Score: 0.35
Reference ID: 1912949

## 2016-07-29 LAB — HEPATITIS C GENOTYPE

## 2016-07-29 LAB — HCV RNA, QN PCR RFLX GENO, LIPA
HCV RNA, PCR, QN: 1830000 [IU]/mL — AB
HCV RNA, PCR, QN: 6.26 log IU/mL — ABNORMAL HIGH

## 2016-07-30 ENCOUNTER — Other Ambulatory Visit: Payer: Self-pay | Admitting: Internal Medicine

## 2016-07-30 ENCOUNTER — Other Ambulatory Visit: Payer: Self-pay | Admitting: Pharmacist

## 2016-07-30 MED ORDER — LEDIPASVIR-SOFOSBUVIR 90-400 MG PO TABS: 1.0000 | ORAL_TABLET | Freq: Every day | ORAL | 2 refills | Status: DC

## 2016-08-06 ENCOUNTER — Other Ambulatory Visit: Payer: Self-pay | Admitting: Pharmacist

## 2016-08-06 DIAGNOSIS — B182 Chronic viral hepatitis C: Secondary | ICD-10-CM

## 2016-08-06 MED ORDER — LEDIPASVIR-SOFOSBUVIR 90-400 MG PO TABS: 1.0000 | ORAL_TABLET | Freq: Every day | ORAL | 2 refills | Status: DC

## 2016-08-07 ENCOUNTER — Other Ambulatory Visit: Payer: Self-pay | Admitting: Pharmacist

## 2016-08-07 DIAGNOSIS — B182 Chronic viral hepatitis C: Secondary | ICD-10-CM

## 2016-08-07 MED ORDER — LEDIPASVIR-SOFOSBUVIR 90-400 MG PO TABS: 1.0000 | ORAL_TABLET | Freq: Every day | ORAL | 2 refills | Status: DC

## 2016-08-07 MED ORDER — LEDIPASVIR-SOFOSBUVIR 90-400 MG PO TABS
1.0000 | ORAL_TABLET | Freq: Every day | ORAL | 2 refills | Status: DC
Start: 2016-08-07 — End: 2016-08-07

## 2016-08-09 ENCOUNTER — Encounter: Payer: Self-pay | Admitting: Occupational Therapy

## 2016-08-09 NOTE — Therapy (Signed)
[facility] [address] [place], [place], [address] Phone: [phone]   Fax:  [phone]  Patient Details  Name: Brandy Sherman MRN: [id number] Date of Birth: 07-18-1990 Referring Provider:  No ref. provider found  Encounter Date: 08/09/2016  OCCUPATIONAL THERAPY DISCHARGE SUMMARY  Visits from Start of Care: 2  Current functional level related to goals / functional outcomes:     OT Short Term Goals - 05/30/16 1709      OT SHORT TERM GOAL #1   Title Pt will verbalize understanding of memory/cognitive compensation strategies.--check STGs 06/27/16   Time 4   Period Weeks   Status Educated, but unable to review     OT [place] #2   Title Pt will perform simple money management/change making in prep for work activities with 75% accuracy.   Time 4   Period Weeks   Status Not met/not fully addressed     OT SHORT TERM GOAL #3   Title Pt will perform mod-complex to complex planning/problem solving activities with at least 75% accuracy.   Time 4   Period Weeks   Status Not met/not fully addressed     OT SHORT TERM GOAL #4   Title Pt will be able to alternate attention between at least 2 tasks with at least 85% accuracy in prep for work tasks.   Time 4   Period Weeks   Status Not met/not fully addressed           OT Long Term Goals - 05/30/16 1707      OT LONG TERM GOAL #1   Title Pt will utilize memory compensation strategies successfully at least 85% of the time.--check LTGs 07/28/16   Time 8   Period Weeks   Status New     OT LONG TERM GOAL #2   Title Pt will perform simple money management/change making in prep for work activities with 90% accuracy.   Time 8   Period Weeks   Status Not met/not fully addressed     OT LONG TERM GOAL #3   Title Pt will perform mod-complex to complex planning/problem solving activities with at least 90% accuracy.   Time 8   Period Weeks   Status Not met/not  fully addressed     OT LONG TERM GOAL #4   Title Pt will be able to divide attention between at least 2 tasks with at least 90% accuracy in prep for work tasks.   Time 8   Period Weeks   Status Not met/not fully addressed     OT LONG TERM GOAL #5   Title Pt will perform simple cooking task safely without cueing.   Time 8   Period Weeks   Status Not met/not fully addressed        Remaining deficits: Goals not fully address/met due to pt not returning to OT (only seen for eval +1 treatment)   Education / Equipment:  Pt instructed in memory compensation strategies, but education not fully met due to not returning to OT.  Plan: Patient agrees to discharge.  Patient goals were not met. Patient is being discharged due to not returning since the last visit.  ?????        [facility] 08/09/2016, 8:35 AM  [facility] [address] [place], [place], [address] Phone: [phone]   Fax:  Joanna, OTR/L [facility] [place], [place]  [address] [phone] phone [phone] 08/09/16 8:35 AM

## 2016-08-14 ENCOUNTER — Other Ambulatory Visit: Payer: Self-pay | Admitting: Pharmacist

## 2016-08-14 DIAGNOSIS — B182 Chronic viral hepatitis C: Secondary | ICD-10-CM

## 2016-08-14 MED ORDER — SOFOSBUV-VELPATASV-VOXILAPREV 400-100-100 MG PO TABS: 1.0000 | ORAL_TABLET | Freq: Every day | ORAL | 2 refills | Status: DC

## 2016-09-20 ENCOUNTER — Telehealth: Payer: Self-pay | Admitting: Neurology

## 2016-09-20 NOTE — Telephone Encounter (Signed)
Called back and left mssg to let pt know that 1st available f/u w/ Dr. Lucia GaskinsAhern is in July. She may call back to schedule.

## 2016-09-20 NOTE — Telephone Encounter (Signed)
Appointment Request From: Tereasa CoopHannah Reinhardt    With Provider: Anson FretAhern, Antonia B, MD [[facility]]    Preferred Date Range: From 09/27/2016 To 09/27/2016    Preferred Times: Thursday Afternoon    Reason for visit: Office Visit    Comments:  I need to schedule an appointment with Dr Lucia GaskinsAhern if possible to discuss possibly having another MRI to assess brain injury, there has been further possible brain injury since last seen.   Thank you, Tereasa CoopHannah Tesoro

## 2016-09-25 ENCOUNTER — Telehealth: Payer: Self-pay | Admitting: Pharmacist

## 2016-09-25 NOTE — Telephone Encounter (Signed)
Dr. Earlene Plateravis from 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 Asked me if she should continue trying to treat her Hep C (she has not started Vosevi yet).  I told her to definitely hold off that we do not treat people who are actively using as they are not reliable to take medications and can just reinfect themselves in the process.  She will need to be clean and in a program for ~1 year before we will treat her.  Dr. Luciana Axeomer aware.

## 2016-12-12 ENCOUNTER — Ambulatory Visit: Payer: PRIVATE HEALTH INSURANCE | Admitting: Obstetrics and Gynecology

## 2017-01-08 ENCOUNTER — Telehealth: Payer: Self-pay | Admitting: *Deleted

## 2017-01-08 NOTE — Telephone Encounter (Signed)
Patient is in 08 recall for 12/2016. Tried to contact patient however her VM is full. Will try again later -eh

## 2017-01-31 NOTE — Telephone Encounter (Signed)
Left message for patient to call to schedule AEX/PAP -eh

## 2017-02-04 NOTE — Telephone Encounter (Signed)
Please send a letter

## 2017-02-04 NOTE — Telephone Encounter (Signed)
Patient has been contacted twice regarding scheduling with no return call. Please advise on recall status/letter

## 2017-02-05 ENCOUNTER — Encounter: Payer: Self-pay | Admitting: *Deleted

## 2017-02-05 NOTE — Telephone Encounter (Signed)
Letter sent-eh 

## 2017-05-13 ENCOUNTER — Telehealth: Payer: Self-pay | Admitting: *Deleted

## 2017-05-13 NOTE — Telephone Encounter (Signed)
Called patient and LVM informing patient that appt scheduled for Tuesday 2/5 will need to be r/s d/t MD being out of office unexpectedly. Left office number for patient to call and r/s appt.

## 2017-05-14 ENCOUNTER — Ambulatory Visit: Payer: PRIVATE HEALTH INSURANCE | Admitting: Neurology

## 2017-05-14 NOTE — Telephone Encounter (Signed)
Called patient again to r/s appointment. LVM with office number asking for call back.

## 2018-08-12 IMAGING — MR MR HEAD W/O CM
10 of 12 series · 32 of 48 positions shown · non-contrast
Comparison: CT head without contrast 01/18/2016

CLINICAL DATA: Drug overdose with higher when. Seizure-like
activity.

EXAM:
MRI HEAD WITHOUT CONTRAST
TECHNIQUE: Multiplanar, multiecho pulse sequences of the brain and surrounding
structures were obtained without intravenous contrast.

[Series 2: FLAIR · sagittal · 5.0mm · 0.47mm/px · 1 of 23 slices shown (1 of 2)]
[im 1/23]
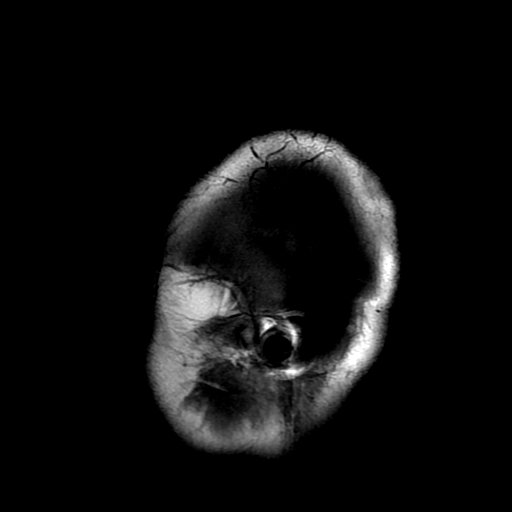

[Series 4: DWI · axial · 3.0mm · 0.94mm/px · z∈[-93,+59]mm · 7 of 106 slices shown (1 of 2)]
[im 1/106]
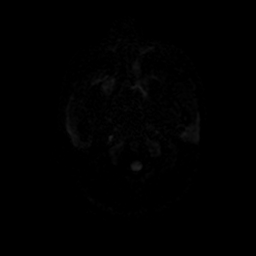
[im 18/106]
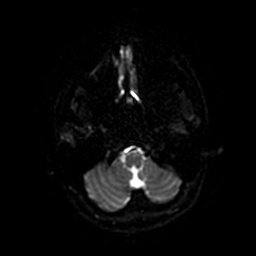
[im 36/106]
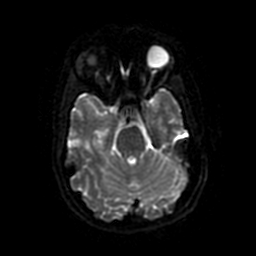
[im 53/106]
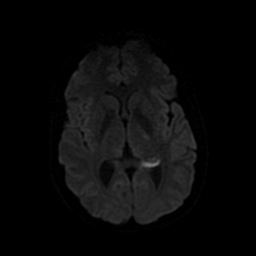
[im 71/106]
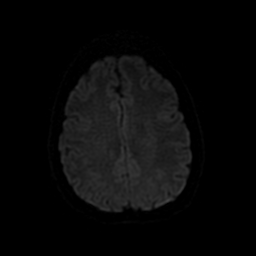
[im 88/106]
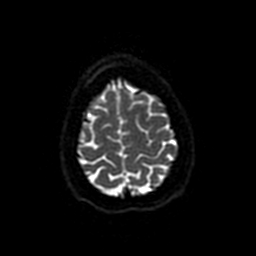
[im 106/106]
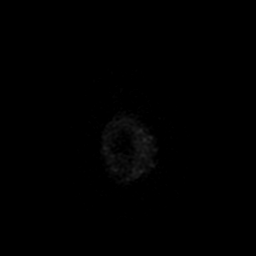

[Series 5: DWI · coronal · 5.0mm · 0.94mm/px · 5 of 70 slices shown (2 of 2)]
[im 1/70]
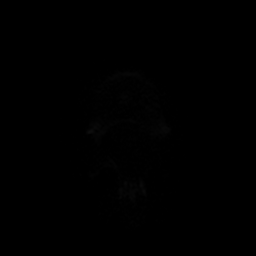
[im 18/70]
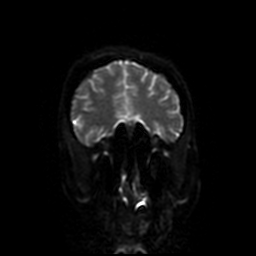
[im 35/70]
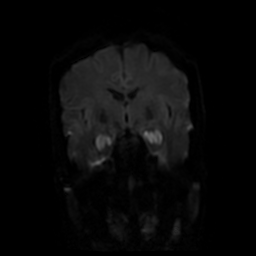
[im 52/70]
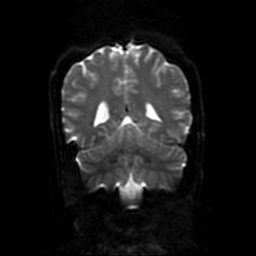
[im 70/70]
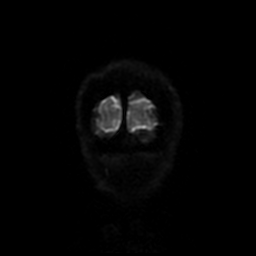

[Series 6: T2 · axial · 5.0mm · 0.47mm/px · z∈[-93,+59]mm · 2 of 27 slices shown (1 of 2)]
[im 1/27]
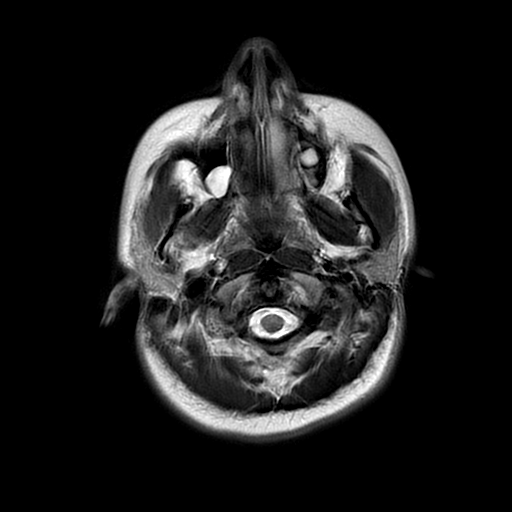
[im 27/27]
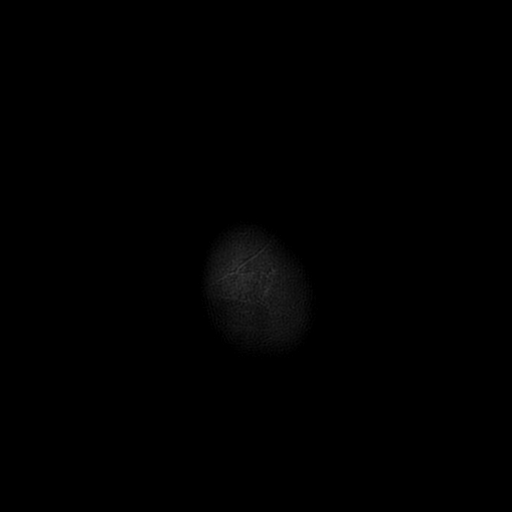

[Series 7: FLAIR · axial · 5.0mm · 0.47mm/px · z∈[-93,+59]mm · 2 of 27 slices shown (2 of 2)]
[im 1/27]
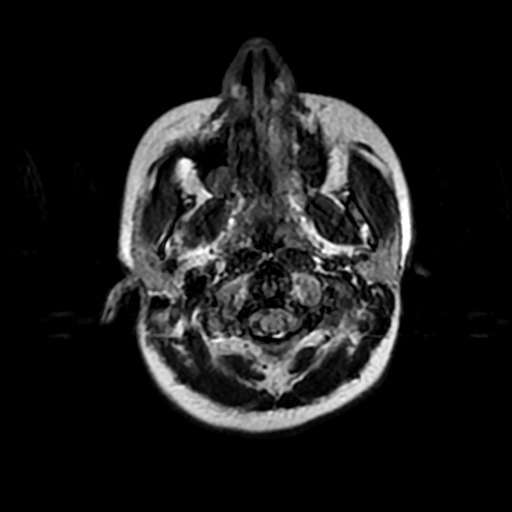
[im 27/27]
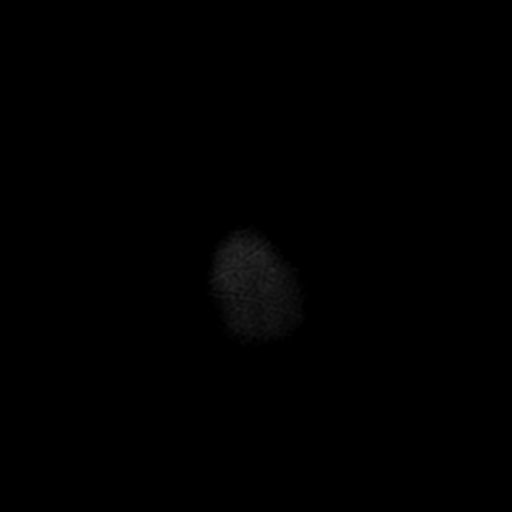

[Series 8: (person_name) · axial · 3.0mm · 0.47mm/px · z∈[-95,-29]mm · 4 of 108 slices shown]
[im 1/108]
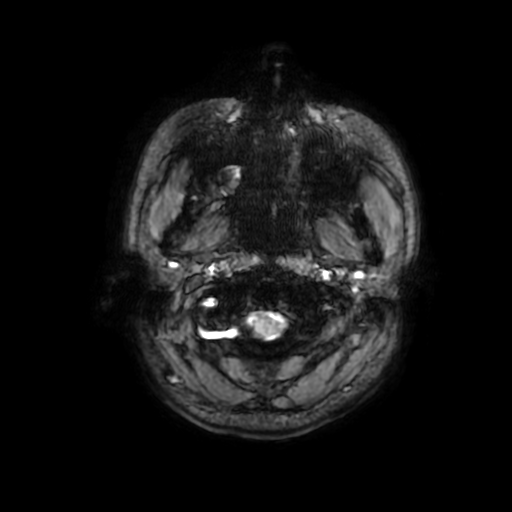
[im 16/108]
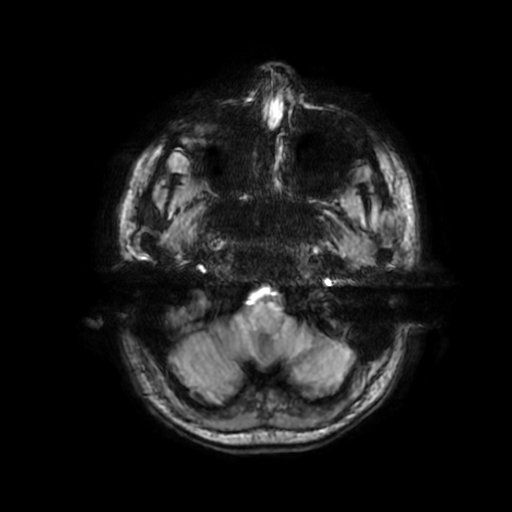
[im 31/108]
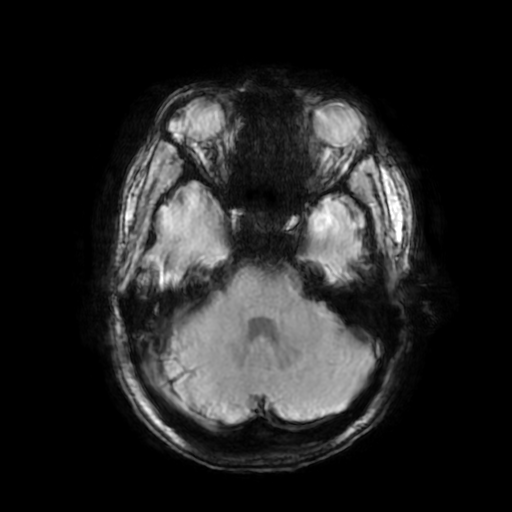
[im 46/108]
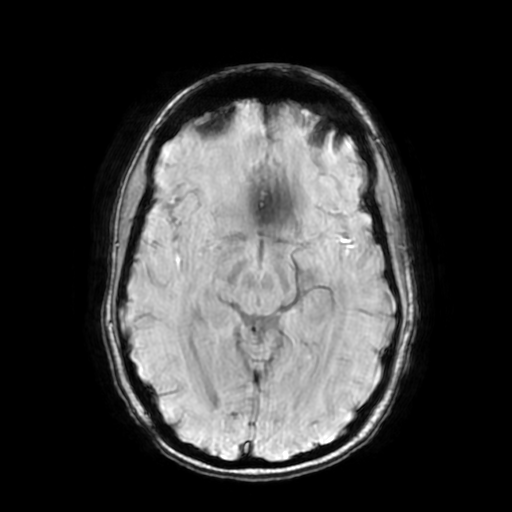

[Series 9: T2 · coronal · 3.5mm · 0.35mm/px · 2 of 21 slices shown (2 of 2)]
[im 1/21]
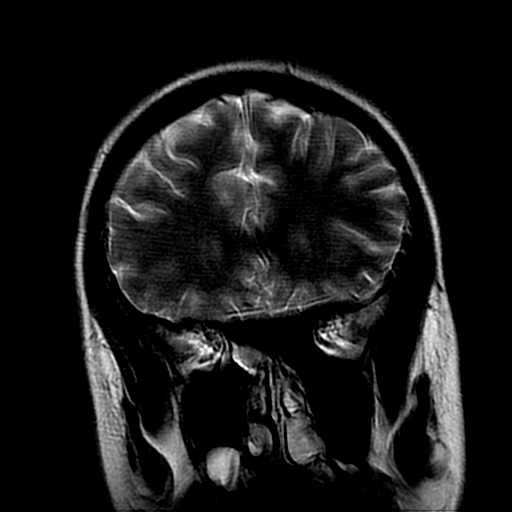
[im 21/21]
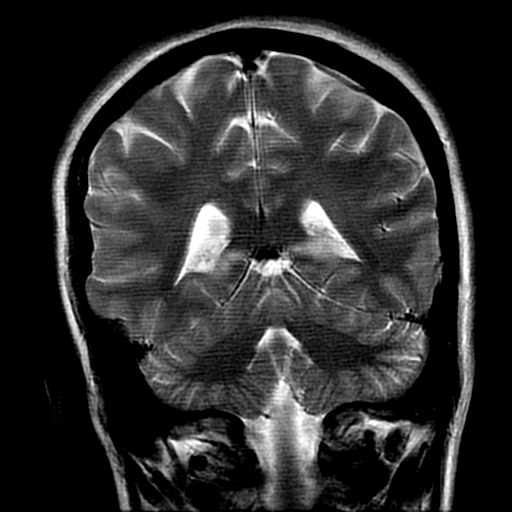

[Series 11: T2 post-contrast · coronal · 5.0mm · 0.39mm/px · 2 of 29 slices shown]
[im 1/29]
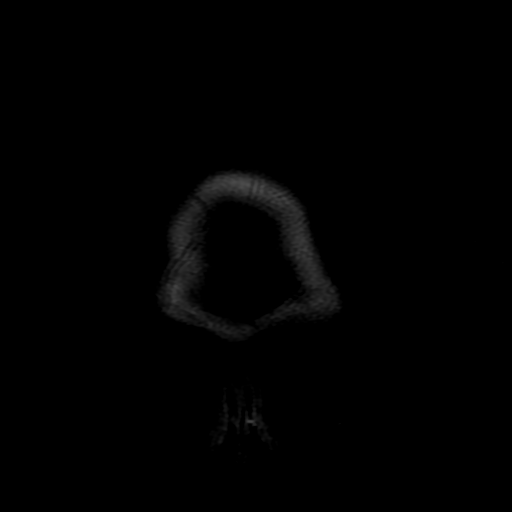
[im 29/29]
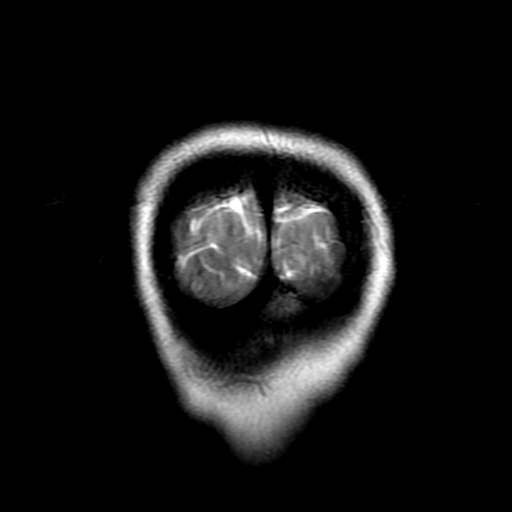

[Series 450: ADC · axial · 3.0mm · 0.94mm/px · z∈[-93,+59]mm · 4 of 53 slices shown (1 of 2)]
[im 1/53]
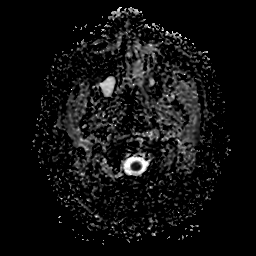
[im 18/53]
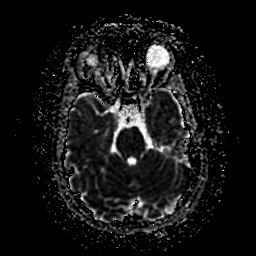
[im 35/53]
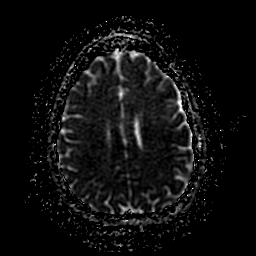
[im 53/53]
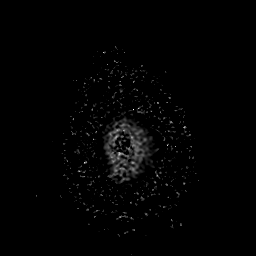

[Series 550: ADC · coronal · 5.0mm · 0.94mm/px · 3 of 35 slices shown (2 of 2)]
[im 1/35]
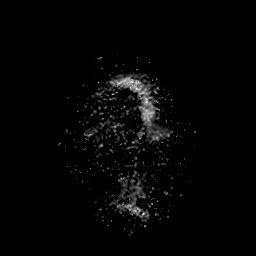
[im 18/35]
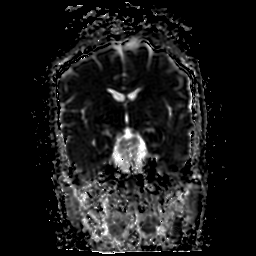
[im 35/35]
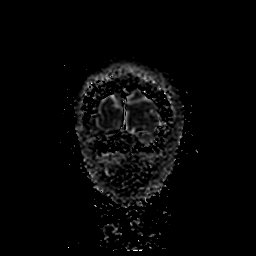

[32 of 48 positions shown; findings below may reference images not displayed]

FINDINGS: Brain: Profound restricted diffusion is evident within the medial
temporal lobes. Abnormal signal is present throughout the
hippocampus. Increased T2 signal is noted as well. No other
significant white matter changes are present in the brain. The
brainstem and cerebellum are normal. No other acute infarct is
present.

The ventricles are of normal size. No significant extra-axial fluid
collection is present.

Vascular: Flow is present in the major intracranial arteries.

Skull and upper cervical spine: The skullbase is within normal
limits. Midline sagittal structures are otherwise unremarkable.

Sinuses/Orbits: Polyps in mucosal thickening are noted along the
floor of the maxillary sinuses bilaterally. The paranasal sinuses
are otherwise clear. The globes and orbits are intact.
IMPRESSION: 1. Extensive restricted diffusion throughout the hippocampus
bilaterally, slightly more prominent left than right. This is
compatible with seizures, potentially related to withdrawal. This
may represent status epilepticus. Bilateral ischemic changes are
considered much less likely.

## 2019-11-16 ENCOUNTER — Other Ambulatory Visit: Payer: Self-pay | Admitting: *Deleted
